# Patient Record
Sex: Male | Born: 1975 | Race: White | Hispanic: No | Marital: Married | Smoking: Light tobacco smoker
Health system: Southern US, Community
[De-identification: ages and names within clinical notes are randomized; demographics above are authoritative.]

## PROBLEM LIST (undated history)

## (undated) DIAGNOSIS — I2510 Atherosclerotic heart disease of native coronary artery without angina pectoris: Secondary | ICD-10-CM

## (undated) DIAGNOSIS — Z720 Tobacco use: Secondary | ICD-10-CM

## (undated) DIAGNOSIS — E785 Hyperlipidemia, unspecified: Secondary | ICD-10-CM

## (undated) DIAGNOSIS — I119 Hypertensive heart disease without heart failure: Secondary | ICD-10-CM

## (undated) DIAGNOSIS — E119 Type 2 diabetes mellitus without complications: Secondary | ICD-10-CM

## (undated) HISTORY — DX: Tobacco use: Z72.0

## (undated) HISTORY — DX: Hypertensive heart disease without heart failure: I11.9

---

## 2005-09-26 ENCOUNTER — Ambulatory Visit: Payer: Self-pay | Admitting: Internal Medicine

## 2009-03-17 ENCOUNTER — Ambulatory Visit: Payer: Self-pay | Admitting: Unknown Physician Specialty

## 2009-04-13 ENCOUNTER — Ambulatory Visit: Payer: Self-pay | Admitting: Unknown Physician Specialty

## 2009-10-07 ENCOUNTER — Ambulatory Visit: Payer: Self-pay | Admitting: Unknown Physician Specialty

## 2015-12-07 ENCOUNTER — Inpatient Hospital Stay
Admission: EM | Admit: 2015-12-07 | Discharge: 2015-12-08 | DRG: 247 | Disposition: A | Discharge status: Home / Self Care | Payer: BC Managed Care – PPO | Attending: Internal Medicine | Admitting: Internal Medicine

## 2015-12-07 ENCOUNTER — Emergency Department: Payer: BC Managed Care – PPO

## 2015-12-07 ENCOUNTER — Encounter
Admission: EM | Disposition: A | Discharge status: Home / Self Care | Payer: Self-pay | Source: Home / Self Care | Attending: Internal Medicine

## 2015-12-07 ENCOUNTER — Encounter: Payer: Self-pay | Admitting: Emergency Medicine

## 2015-12-07 ENCOUNTER — Observation Stay: Payer: BC Managed Care – PPO

## 2015-12-07 DIAGNOSIS — Z833 Family history of diabetes mellitus: Secondary | ICD-10-CM | POA: Diagnosis not present

## 2015-12-07 DIAGNOSIS — E119 Type 2 diabetes mellitus without complications: Secondary | ICD-10-CM | POA: Diagnosis present

## 2015-12-07 DIAGNOSIS — Z9114 Patient's other noncompliance with medication regimen: Secondary | ICD-10-CM

## 2015-12-07 DIAGNOSIS — K760 Fatty (change of) liver, not elsewhere classified: Secondary | ICD-10-CM | POA: Diagnosis present

## 2015-12-07 DIAGNOSIS — R0789 Other chest pain: Secondary | ICD-10-CM | POA: Diagnosis present

## 2015-12-07 DIAGNOSIS — I2510 Atherosclerotic heart disease of native coronary artery without angina pectoris: Secondary | ICD-10-CM

## 2015-12-07 DIAGNOSIS — R079 Chest pain, unspecified: Secondary | ICD-10-CM

## 2015-12-07 DIAGNOSIS — R112 Nausea with vomiting, unspecified: Secondary | ICD-10-CM

## 2015-12-07 DIAGNOSIS — I200 Unstable angina: Secondary | ICD-10-CM

## 2015-12-07 DIAGNOSIS — F17200 Nicotine dependence, unspecified, uncomplicated: Secondary | ICD-10-CM

## 2015-12-07 DIAGNOSIS — I214 Non-ST elevation (NSTEMI) myocardial infarction: Principal | ICD-10-CM | POA: Diagnosis present

## 2015-12-07 DIAGNOSIS — Z880 Allergy status to penicillin: Secondary | ICD-10-CM

## 2015-12-07 DIAGNOSIS — E785 Hyperlipidemia, unspecified: Secondary | ICD-10-CM

## 2015-12-07 DIAGNOSIS — I160 Hypertensive urgency: Secondary | ICD-10-CM | POA: Diagnosis not present

## 2015-12-07 DIAGNOSIS — Y929 Unspecified place or not applicable: Secondary | ICD-10-CM

## 2015-12-07 DIAGNOSIS — I10 Essential (primary) hypertension: Secondary | ICD-10-CM | POA: Diagnosis not present

## 2015-12-07 DIAGNOSIS — I25110 Atherosclerotic heart disease of native coronary artery with unstable angina pectoris: Secondary | ICD-10-CM | POA: Diagnosis present

## 2015-12-07 DIAGNOSIS — I952 Hypotension due to drugs: Secondary | ICD-10-CM | POA: Diagnosis not present

## 2015-12-07 DIAGNOSIS — E1159 Type 2 diabetes mellitus with other circulatory complications: Secondary | ICD-10-CM | POA: Diagnosis not present

## 2015-12-07 DIAGNOSIS — E876 Hypokalemia: Secondary | ICD-10-CM | POA: Diagnosis not present

## 2015-12-07 DIAGNOSIS — T463X5A Adverse effect of coronary vasodilators, initial encounter: Secondary | ICD-10-CM | POA: Diagnosis present

## 2015-12-07 DIAGNOSIS — F17210 Nicotine dependence, cigarettes, uncomplicated: Secondary | ICD-10-CM | POA: Diagnosis present

## 2015-12-07 DIAGNOSIS — Z8249 Family history of ischemic heart disease and other diseases of the circulatory system: Secondary | ICD-10-CM | POA: Diagnosis not present

## 2015-12-07 DIAGNOSIS — T402X5A Adverse effect of other opioids, initial encounter: Secondary | ICD-10-CM | POA: Diagnosis present

## 2015-12-07 HISTORY — PX: CARDIAC CATHETERIZATION: SHX172

## 2015-12-07 HISTORY — DX: Atherosclerotic heart disease of native coronary artery without angina pectoris: I25.10

## 2015-12-07 HISTORY — DX: Hyperlipidemia, unspecified: E78.5

## 2015-12-07 HISTORY — DX: Type 2 diabetes mellitus without complications: E11.9

## 2015-12-07 LAB — CBC
HCT: 49.9 % (ref 40.0–52.0)
Hemoglobin: 17.8 g/dL (ref 13.0–18.0)
MCH: 31.9 pg (ref 26.0–34.0)
MCHC: 35.7 g/dL (ref 32.0–36.0)
MCV: 89.2 fL (ref 80.0–100.0)
Platelets: 210 10*3/uL (ref 150–440)
RBC: 5.59 MIL/uL (ref 4.40–5.90)
RDW: 13 % (ref 11.5–14.5)
WBC: 10.1 10*3/uL (ref 3.8–10.6)

## 2015-12-07 LAB — COMPREHENSIVE METABOLIC PANEL
ALK PHOS: 61 U/L (ref 38–126)
ALT: 33 U/L (ref 17–63)
AST: 25 U/L (ref 15–41)
Albumin: 4.3 g/dL (ref 3.5–5.0)
Anion gap: 14 (ref 5–15)
BUN: 9 mg/dL (ref 6–20)
CALCIUM: 9.1 mg/dL (ref 8.9–10.3)
CHLORIDE: 104 mmol/L (ref 101–111)
CO2: 19 mmol/L — ABNORMAL LOW (ref 22–32)
Creatinine, Ser: 0.79 mg/dL (ref 0.61–1.24)
GFR calc Af Amer: >60 mL/min (ref >60)
Glucose, Bld: 294 mg/dL — ABNORMAL HIGH (ref 65–99)
Potassium: 4 mmol/L (ref 3.5–5.1)
Sodium: 137 mmol/L (ref 135–145)
Total Bilirubin: 0.3 mg/dL (ref 0.3–1.2)
Total Protein: 7.3 g/dL (ref 6.5–8.1)

## 2015-12-07 LAB — LIPID PANEL
CHOL/HDL RATIO: 4.9 ratio
Cholesterol: 244 mg/dL — ABNORMAL HIGH (ref 0–200)
HDL: 50 mg/dL (ref >40)
LDL CALC: 140 mg/dL — AB (ref 0–99)
TRIGLYCERIDES: 272 mg/dL — AB (ref <150)
VLDL: 54 mg/dL — ABNORMAL HIGH (ref 0–40)

## 2015-12-07 LAB — URINE DRUG SCREEN, QUALITATIVE (ARMC ONLY)
AMPHETAMINES, UR SCREEN: NOT DETECTED
Barbiturates, Ur Screen: NOT DETECTED
Benzodiazepine, Ur Scrn: NOT DETECTED
Cannabinoid 50 Ng, Ur ~~LOC~~: NOT DETECTED
Cocaine Metabolite,Ur ~~LOC~~: NOT DETECTED
MDMA (ECSTASY) UR SCREEN: NOT DETECTED
Methadone Scn, Ur: NOT DETECTED
Opiate, Ur Screen: POSITIVE — AB
Phencyclidine (PCP) Ur S: NOT DETECTED
TRICYCLIC, UR SCREEN: NOT DETECTED

## 2015-12-07 LAB — TROPONIN I
TROPONIN I: 4.29 ng/mL — AB (ref <0.03)
Troponin I: 0.17 ng/mL (ref <0.03)
Troponin I: 3.42 ng/mL (ref <0.03)

## 2015-12-07 LAB — LIPASE, BLOOD: LIPASE: 50 U/L (ref 11–51)

## 2015-12-07 LAB — HEMOGLOBIN A1C: Hgb A1c MFr Bld: 11.7 % — ABNORMAL HIGH (ref 4.0–6.0)

## 2015-12-07 SURGERY — LEFT HEART CATH AND CORONARY ANGIOGRAPHY
Anesthesia: Moderate Sedation

## 2015-12-07 MED ORDER — MIDAZOLAM HCL 2 MG/2ML IJ SOLN
INTRAMUSCULAR | Status: AC
  Filled 2015-12-07: qty 2

## 2015-12-07 MED ORDER — HYDRALAZINE HCL 25 MG PO TABS
25.0000 mg | ORAL_TABLET | Freq: Three times a day (TID) | ORAL | Status: DC
  Administered 2015-12-07: 25 mg via ORAL
  Filled 2015-12-07: qty 1

## 2015-12-07 MED ORDER — HYDROCODONE-ACETAMINOPHEN 5-325 MG PO TABS
1.0000 | ORAL_TABLET | Freq: Four times a day (QID) | ORAL | Status: DC | PRN
  Administered 2015-12-07: 1 via ORAL
  Filled 2015-12-07 (×2): qty 1

## 2015-12-07 MED ORDER — FENTANYL CITRATE (PF) 100 MCG/2ML IJ SOLN
INTRAMUSCULAR | Status: AC
  Filled 2015-12-07: qty 2

## 2015-12-07 MED ORDER — ENOXAPARIN SODIUM 40 MG/0.4ML ~~LOC~~ SOLN: 40.0000 mg | SUBCUTANEOUS | Status: DC

## 2015-12-07 MED ORDER — GI COCKTAIL ~~LOC~~: 30.0000 mL | Freq: Four times a day (QID) | ORAL | Status: DC | PRN

## 2015-12-07 MED ORDER — ASPIRIN 81 MG PO CHEW
81.0000 mg | CHEWABLE_TABLET | Freq: Every day | ORAL | Status: DC
  Administered 2015-12-08: 81 mg via ORAL
  Filled 2015-12-07: qty 1

## 2015-12-07 MED ORDER — SODIUM CHLORIDE 0.9% FLUSH: 3.0000 mL | INTRAVENOUS | Status: DC | PRN

## 2015-12-07 MED ORDER — SODIUM CHLORIDE 0.9 % IV SOLN: 250.0000 mL | INTRAVENOUS | Status: DC | PRN

## 2015-12-07 MED ORDER — NITROGLYCERIN 1 MG/10 ML FOR IR/CATH LAB
INTRA_ARTERIAL | Status: DC | PRN
  Administered 2015-12-07: 300 ug via INTRACORONARY
  Administered 2015-12-07: 200 ug via INTRACORONARY

## 2015-12-07 MED ORDER — TICAGRELOR 90 MG PO TABS
ORAL_TABLET | ORAL | Status: DC | PRN
Start: 2015-12-07 — End: 2015-12-07
  Administered 2015-12-07: 180 mg via ORAL

## 2015-12-07 MED ORDER — SODIUM CHLORIDE 0.9% FLUSH: 3.0000 mL | Freq: Two times a day (BID) | INTRAVENOUS | Status: DC

## 2015-12-07 MED ORDER — MORPHINE SULFATE (PF) 4 MG/ML IV SOLN
4.0000 mg | Freq: Once | INTRAVENOUS | Status: AC
  Administered 2015-12-07: 4 mg via INTRAVENOUS
  Filled 2015-12-07: qty 1

## 2015-12-07 MED ORDER — LISINOPRIL 10 MG PO TABS
10.0000 mg | ORAL_TABLET | Freq: Every day | ORAL | Status: DC
  Administered 2015-12-07 – 2015-12-08 (×2): 10 mg via ORAL
  Filled 2015-12-07 (×2): qty 1

## 2015-12-07 MED ORDER — LABETALOL HCL 5 MG/ML IV SOLN
INTRAVENOUS | Status: AC
  Filled 2015-12-07: qty 4

## 2015-12-07 MED ORDER — VERAPAMIL HCL 2.5 MG/ML IV SOLN
INTRAVENOUS | Status: DC | PRN
  Administered 2015-12-07 (×2): 2.5 mg via INTRAVENOUS

## 2015-12-07 MED ORDER — NITROGLYCERIN 0.4 MG SL SUBL
0.4000 mg | SUBLINGUAL_TABLET | SUBLINGUAL | Status: DC | PRN
  Administered 2015-12-07 (×3): 0.4 mg via SUBLINGUAL
  Filled 2015-12-07 (×2): qty 1

## 2015-12-07 MED ORDER — SODIUM CHLORIDE 0.9 % IV SOLN
INTRAVENOUS | Status: DC
  Administered 2015-12-07 – 2015-12-08 (×2): via INTRAVENOUS

## 2015-12-07 MED ORDER — ASPIRIN 81 MG PO CHEW
324.0000 mg | CHEWABLE_TABLET | Freq: Once | ORAL | Status: AC
  Administered 2015-12-07: 324 mg via ORAL
  Filled 2015-12-07: qty 4

## 2015-12-07 MED ORDER — GI COCKTAIL ~~LOC~~
30.0000 mL | Freq: Once | ORAL | Status: AC
  Administered 2015-12-07: 30 mL via ORAL
  Filled 2015-12-07: qty 30

## 2015-12-07 MED ORDER — ATORVASTATIN CALCIUM 20 MG PO TABS
40.0000 mg | ORAL_TABLET | Freq: Every day | ORAL | Status: DC
  Filled 2015-12-07: qty 1

## 2015-12-07 MED ORDER — SODIUM CHLORIDE 0.9 % IV SOLN
INTRAVENOUS | Status: DC
  Administered 2015-12-07: 10:00:00 via INTRAVENOUS

## 2015-12-07 MED ORDER — TICAGRELOR 90 MG PO TABS
ORAL_TABLET | ORAL | Status: AC
  Filled 2015-12-07: qty 2

## 2015-12-07 MED ORDER — ACETAMINOPHEN 325 MG PO TABS
650.0000 mg | ORAL_TABLET | ORAL | Status: DC | PRN
  Administered 2015-12-08: 650 mg via ORAL
  Filled 2015-12-07: qty 2

## 2015-12-07 MED ORDER — CARVEDILOL 6.25 MG PO TABS: 6.2500 mg | ORAL_TABLET | Freq: Two times a day (BID) | ORAL | Status: DC

## 2015-12-07 MED ORDER — LABETALOL HCL 5 MG/ML IV SOLN
20.0000 mg | Freq: Once | INTRAVENOUS | Status: AC
  Administered 2015-12-07: 20 mg via INTRAVENOUS

## 2015-12-07 MED ORDER — IOPAMIDOL (ISOVUE-370) INJECTION 76%
100.0000 mL | Freq: Once | INTRAVENOUS | Status: AC | PRN
  Administered 2015-12-07: 100 mL via INTRAVENOUS

## 2015-12-07 MED ORDER — HEPARIN SODIUM (PORCINE) 1000 UNIT/ML IJ SOLN
INTRAMUSCULAR | Status: AC
  Filled 2015-12-07: qty 1

## 2015-12-07 MED ORDER — MIDAZOLAM HCL 2 MG/2ML IJ SOLN
INTRAMUSCULAR | Status: DC | PRN
  Administered 2015-12-07 (×3): 1 mg via INTRAVENOUS

## 2015-12-07 MED ORDER — HEPARIN SODIUM (PORCINE) 1000 UNIT/ML IJ SOLN
INTRAMUSCULAR | Status: DC | PRN
  Administered 2015-12-07 (×2): 4000 [IU] via INTRAVENOUS

## 2015-12-07 MED ORDER — ASPIRIN 81 MG PO CHEW: 81.0000 mg | CHEWABLE_TABLET | ORAL | Status: DC

## 2015-12-07 MED ORDER — HEPARIN (PORCINE) IN NACL 2-0.9 UNIT/ML-% IJ SOLN
INTRAMUSCULAR | Status: AC
  Filled 2015-12-07: qty 500

## 2015-12-07 MED ORDER — ISOSORBIDE MONONITRATE ER 30 MG PO TB24
30.0000 mg | ORAL_TABLET | Freq: Every day | ORAL | Status: DC
  Administered 2015-12-07 – 2015-12-08 (×2): 30 mg via ORAL
  Filled 2015-12-07 (×2): qty 1

## 2015-12-07 MED ORDER — ONDANSETRON HCL 4 MG/2ML IJ SOLN
4.0000 mg | Freq: Once | INTRAMUSCULAR | Status: AC
  Administered 2015-12-07: 4 mg via INTRAVENOUS
  Filled 2015-12-07: qty 2

## 2015-12-07 MED ORDER — TICAGRELOR 90 MG PO TABS
90.0000 mg | ORAL_TABLET | Freq: Two times a day (BID) | ORAL | Status: DC
  Administered 2015-12-07 – 2015-12-08 (×2): 90 mg via ORAL
  Filled 2015-12-07 (×2): qty 1

## 2015-12-07 MED ORDER — VERAPAMIL HCL 2.5 MG/ML IV SOLN
INTRAVENOUS | Status: AC
  Filled 2015-12-07: qty 2

## 2015-12-07 MED ORDER — FENTANYL CITRATE (PF) 100 MCG/2ML IJ SOLN
50.0000 ug | Freq: Once | INTRAMUSCULAR | Status: AC
  Administered 2015-12-07: 50 ug via INTRAVENOUS
  Filled 2015-12-07: qty 2

## 2015-12-07 MED ORDER — ASPIRIN 81 MG PO CHEW
CHEWABLE_TABLET | ORAL | Status: AC
  Filled 2015-12-07: qty 4

## 2015-12-07 MED ORDER — KETOROLAC TROMETHAMINE 30 MG/ML IJ SOLN
15.0000 mg | Freq: Once | INTRAMUSCULAR | Status: AC
  Administered 2015-12-07: 15 mg via INTRAVENOUS
  Filled 2015-12-07: qty 1

## 2015-12-07 MED ORDER — NITROGLYCERIN 5 MG/ML IV SOLN
INTRAVENOUS | Status: AC
  Filled 2015-12-07: qty 10

## 2015-12-07 MED ORDER — FENTANYL CITRATE (PF) 100 MCG/2ML IJ SOLN
INTRAMUSCULAR | Status: DC | PRN
  Administered 2015-12-07 (×3): 50 ug via INTRAVENOUS

## 2015-12-07 MED ORDER — ATORVASTATIN CALCIUM 20 MG PO TABS: 40.0000 mg | ORAL_TABLET | Freq: Every day | ORAL | Status: DC

## 2015-12-07 MED ORDER — HYDRALAZINE HCL 20 MG/ML IJ SOLN
10.0000 mg | Freq: Once | INTRAMUSCULAR | Status: AC
  Administered 2015-12-07: 10 mg via INTRAVENOUS
  Filled 2015-12-07: qty 1

## 2015-12-07 MED ORDER — ONDANSETRON HCL 4 MG/2ML IJ SOLN: 4.0000 mg | Freq: Four times a day (QID) | INTRAMUSCULAR | Status: DC | PRN

## 2015-12-07 SURGICAL SUPPLY — 23 items
BALLN TREK RX 2.5X12 (BALLOONS) ×3
BALLOON TREK RX 2.5X12 (BALLOONS) ×1 IMPLANT
CANNULA 5F STIFF (CANNULA) ×3 IMPLANT
CATH INFINITI 5 FR JL3.5 (CATHETERS) ×3 IMPLANT
CATH INFINITI 5FR ANG PIGTAIL (CATHETERS) ×3 IMPLANT
CATH INFINITI 5FR JL4 (CATHETERS) ×3 IMPLANT
CATH OPTITORQUE JACKY 4.0 5F (CATHETERS) ×3 IMPLANT
CATH VISTA GUIDE 6FR XB3 (CATHETERS) ×3 IMPLANT
CATH VISTA GUIDE 6FR XB3.5 (CATHETERS) ×3 IMPLANT
DEVICE CLOSURE MYNXGRIP 6/7F (Vascular Products) ×3 IMPLANT
DEVICE INFLAT 30 PLUS (MISCELLANEOUS) ×3 IMPLANT
DEVICE RAD TR BAND REGULAR (VASCULAR PRODUCTS) ×3 IMPLANT
GLIDESHEATH SLEND SS 6F .021 (SHEATH) ×3 IMPLANT
KIT MANI 3VAL PERCEP (MISCELLANEOUS) ×3 IMPLANT
NEEDLE PERC 18GX7CM (NEEDLE) ×3 IMPLANT
PACK CARDIAC CATH (CUSTOM PROCEDURE TRAY) ×3 IMPLANT
SHEATH AVANTI 5FR X 11CM (SHEATH) ×3 IMPLANT
SHEATH AVANTI 6FR X 11CM (SHEATH) ×3 IMPLANT
STENT XIENCE ALPINE RX 3.0X15 (Permanent Stent) ×3 IMPLANT
WIRE EMERALD 3MM-J .035X150CM (WIRE) ×3 IMPLANT
WIRE HITORQ VERSACORE ST 145CM (WIRE) ×3 IMPLANT
WIRE RUNTHROUGH .014X180CM (WIRE) ×3 IMPLANT
WIRE SAFE-T 1.5MM-J .035X260CM (WIRE) ×3 IMPLANT

## 2015-12-07 NOTE — Progress Notes (Signed)
Pt complains of pain in cath sites. MD notified. Orders received. Pts BP is also elevated MD acknowledged. I will give scheduled BP meds and reassess. I will continue to assess.

## 2015-12-07 NOTE — Consult Note (Signed)
Cardiology Consultation Note  Patient ID: Zachary [name], MRN: [id number], DOB/AGE: Jun 24, 1975 40 y.o. Admit date: 12/07/2015   Date of Consult: 12/07/2015 Primary Physician: No PCP Per Patient Primary Cardiologist: [facility] to [facility] Requesting Physician: Dr. Sherryll Burger, MD  Chief Complaint: Chest pain Reason for Consult: Chest pain in the setting of malignant HTN  HPI: 40 y.o. male with h/o DM2 x 7 years not on medications, poorly controlled HTn x 7 years not on medications, HLD, ongoing tobacco and alcohol abuse, anxiety, and depression who presented to [facility] ED on 7/31 after waking up with sudden onset of dull chest pain that radiated to his back.   He does not have any previously known cardiac history and no family history of premature CAD. He notes his BP is always "high" when he checks in at [facility]. He was in his USOH until around 2 AM this morning when he developed sudden onset of chest pain radiating to the back. Pain was 12/10 and not associated with any symptoms. Pain persisted, thus he presented to [facility]. He is very active at baseline and has never had exertional chest pain. He is an everyday smoker of 0.5-1 ppd x 20 years. He drinks at least one beer daily. He denies any illegal drug abuse.   Upon the patient's arrival to [facility] they were found to have troponin negative x 1, K+ 4.0, SCr 0.79, nl WBC, hgb 17.8. ECG as below, CXR negative. CTA chest without evidence of aortic dissection or aneurysm. He has gone for a RUQ ultrasound that is pending at this time. Currently with 7/10 pain.    Past Medical History:  Diagnosis Date  . Diabetes mellitus without complication (HCC)   . Hypertension       Most Recent Cardiac Studies: none   Surgical History: History reviewed. No pertinent surgical history.   Home Meds: Prior to Admission medications   Not on File    Inpatient Medications:  . fentaNYL (SUBLIMAZE) injection  50 mcg Intravenous Once      Allergies:  Allergies  Allergen  Reactions  . Penicillins Nausea And Vomiting and Rash    Has patient had a PCN reaction causing immediate rash, facial/tongue/throat swelling, SOB or lightheadedness with hypotension: No Has patient had a PCN reaction causing severe rash involving mucus membranes or skin necrosis: No Has patient had a PCN reaction that required hospitalization No Has patient had a PCN reaction occurring within the last 10 years: No If all of the above answers are "NO", then may proceed with Cephalosporin use.     Social History   Social History  . Marital status: Married    Spouse name: N/A  . Number of children: N/A  . Years of education: N/A   Occupational History  . Not on file.   Social History Main Topics  . Smoking status: Current Every Day Smoker    Packs/day: 0.50    Years: 17.00  . Smokeless tobacco: Never Used  . Alcohol use Yes  . Drug use: Unknown  . Sexual activity: Not on file   Other Topics Concern  . Not on file   Social History Narrative  . No narrative on file     Family History  Problem Relation Age of Onset  . Hypertension Father   . Cancer Brother   . Diabetes Mellitus II Maternal Grandfather   . Hypertension Maternal Grandfather   . Osteoporosis Paternal Grandmother      Review of Systems: Review of Systems  Constitutional: Positive for malaise/fatigue. Negative for chills, diaphoresis, fever and weight loss.  HENT: Negative for congestion.   Eyes: Negative for discharge and redness.  Respiratory: Negative for cough, sputum production, shortness of breath and wheezing.   Cardiovascular: Positive for chest pain. Negative for palpitations, orthopnea, claudication, leg swelling and PND.  Gastrointestinal: Negative for abdominal pain, heartburn, nausea and vomiting.  Genitourinary: Negative for hematuria.  Musculoskeletal: Negative for falls and myalgias.  Skin: Negative for rash.  Neurological: Negative for dizziness, tingling, tremors, sensory change,  speech change, focal weakness, loss of consciousness and weakness.  Endo/Heme/Allergies: Does not bruise/bleed easily.  Psychiatric/Behavioral: Positive for substance abuse. The patient is not nervous/anxious.   All other systems reviewed and are negative.   Labs:  Recent Labs  12/07/15 0302  TROPONINI <0.03   Lab Results  Component Value Date   WBC 10.1 12/07/2015   HGB 17.8 12/07/2015   HCT 49.9 12/07/2015   MCV 89.2 12/07/2015   PLT 210 12/07/2015     Recent Labs Lab 12/07/15 0302  NA 137  K 4.0  CL 104  CO2 19*  BUN 9  CREATININE 0.79  CALCIUM 9.1  PROT 7.3  BILITOT 0.3  ALKPHOS 61  ALT 33  AST 25  GLUCOSE 294*   No results found for: CHOL, HDL, LDLCALC, TRIG No results found for: DDIMER  Radiology/Studies:  Dg Chest 2 View  Result Date: 12/07/2015 CLINICAL DATA:  40 year old male with chest pain EXAM: CHEST  2 VIEW COMPARISON:  None. FINDINGS: The heart size and mediastinal contours are within normal limits. Both lungs are clear. The visualized skeletal structures are unremarkable. IMPRESSION: No active cardiopulmonary disease. Electronically Signed   By: Elgie Collard M.D.   On: 12/07/2015 03:42  Ct Angio Chest Aorta W And/or Wo Contrast  Result Date: 12/07/2015 CLINICAL DATA:  40 year old male with chest pain and hypertension EXAM: CT ANGIOGRAPHY CHEST WITH CONTRAST TECHNIQUE: Multidetector CT imaging of the chest was performed using the standard protocol during bolus administration of intravenous contrast. Multiplanar CT image reconstructions and MIPs were obtained to evaluate the vascular anatomy. CONTRAST:  100 cc Isovue 370 COMPARISON:  Chest radiograph dated 12/07/2015 FINDINGS: Minimal bibasilar dependent atelectatic changes of the lungs. The lungs are otherwise clear. There is no pleural effusion or pneumothorax. The central airways are patent. The thoracic aorta appears unremarkable. There is no aneurysmal dilatation or evidence of dissection. The  origins of the great vessels of the aortic arch appear patent. Evaluation for the pulmonary arteries is limited due to suboptimal opacification and timing of the contrast. No definite central pulmonary embolus identified. There is no cardiomegaly or pericardial effusion. No hilar or mediastinal adenopathy. The esophagus is grossly unremarkable. There is no axillary adenopathy. The chest wall soft tissues appear unremarkable. The osseous structures are intact. Moderate atrophy of the visualized right kidney. A partially visualized stone in the upper pole of the right kidney measures 2.4 x 1.4 cm Review of the MIP images confirms the above findings. IMPRESSION: No CT evidence of aortic dissection or aneurysm. Electronically Signed   By: Elgie Collard M.D.   On: 12/07/2015 05:10   EKG: Interpreted by me showed: NSR, 91 bpm, no acute st/t changes  Telemetry: Interpreted by me showed: NSR, 90's bpm  Weights: Filed Weights   12/07/15 0255  Weight: 200 lb (90.7 kg)     Physical Exam: Blood pressure (!) 143/99, pulse 75, temperature 97.5 F (36.4 C), temperature source Oral, resp. rate 14, height 5'  9" (1.753 m), weight 200 lb (90.7 kg), SpO2 97 %. Body mass index is 29.53 kg/m. General: Well developed, well nourished, in no acute distress. Head: Normocephalic, atraumatic, sclera non-icteric, no xanthomas, nares are without discharge.  Neck: Negative for carotid bruits. JVD not elevated. Lungs: Clear bilaterally to auscultation without wheezes, rales, or rhonchi. Breathing is unlabored. Heart: RRR with S1 S2. No murmurs, rubs, or gallops appreciated. Abdomen: Soft, non-tender, non-distended with normoactive bowel sounds. No hepatomegaly. No rebound/guarding. No obvious abdominal masses. Msk:  Strength and tone appear normal for age. Extremities: No clubbing or cyanosis. No edema. Distal pedal pulses are 2+ and equal bilaterally. Neuro: Alert and oriented X 3. No facial asymmetry. No focal  deficit. Moves all extremities spontaneously. Psych:  Responds to questions appropriately with a normal affect.    Assessment and Plan:  Principal Problem:   Malignant hypertension Active Problems:   Atypical chest pain   HLD (hyperlipidemia)   Diabetes mellitus (HCC)    1. Chest pain: -Likely in the setting of the patient's malignant HTN -Continue to cycle troponin, initial troponin negative -Imdur -Check echo -Schedule for Lexiscan Myoview on 8/1 to evaluate for high-risk ischemia if troponin remains negative -Check lipid and A1C for risk stratification   2. Malignant hypertension: -Presented with BP 217/124, currently improved s/p hydralazine -Start lisinopril 10 mg and titrate as needed -Imdur/hydralazine  -Follow up bmet  3. HLD: -Check lipid  4. DM2: -Per IM    Signed, Eula Listen, PA-C [facility] Pager: [phone] 12/07/2015, 7:58 AM

## 2015-12-07 NOTE — Progress Notes (Signed)
2cc's of air removed from right TR band 

## 2015-12-07 NOTE — H&P (Signed)
[employer] [facility] [place] [facility]   PATIENT NAME: Zachary [name]    MR#:  [id number]  DATE OF BIRTH:  October 27, 1975  DATE OF ADMISSION:  12/07/2015  PRIMARY CARE PHYSICIAN: No PCP Per Patient   REQUESTING/REFERRING PHYSICIAN: Gayla Doss, MD  CHIEF COMPLAINT:   Chief Complaint  Patient presents with  . Chest Pain  . Shortness of Breath   HISTORY OF PRESENT ILLNESS:  Zachary [name]  is a 40 y.o. male with a known history of HTN, DM is being admitted for unstable angina. Patient is not taking any antihypertensive/diabetic medications He woke up with sudden onset severe substernal chest pain radiating to the back at 2-2:30 AM, constant since onset, no modifying factors. He also reports some radiation of his pain to his left arm which has been resolved.  Currently his pain is 5 out of 10. He reports the pain is associated with shortness of breath and nausea. He has never had this previously.  PAST MEDICAL HISTORY:   Past Medical History:  Diagnosis Date  . Diabetes mellitus without complication (HCC)   . Hypertension    PAST SURGICAL HISTORY:  History reviewed. No pertinent surgical history.  SOCIAL HISTORY:   Social History  Substance Use Topics  . Smoking status: Current Every Day Smoker    Packs/day: 0.50    Years: 17.00  . Smokeless tobacco: Never Used  . Alcohol use Yes  He drinks several beers a day and rum on and off for last 20 years Tells me he has been smoking one pack of severe daily for last 20 years Works as a Runner, broadcasting/film/video in [place] FAMILY HISTORY:   Family History  Problem Relation Age of Onset  . Hypertension Father   . Cancer Brother   . Diabetes Mellitus II Maternal Grandfather   . Hypertension Maternal Grandfather   . Osteoporosis Paternal Grandmother   Father has history of atrial fibrillation and left bundle-branch block Great Grandfather had CHF DRUG ALLERGIES:   Allergies  Allergen Reactions  . Penicillins Nausea And  Vomiting and Rash    Has patient had a PCN reaction causing immediate rash, facial/tongue/throat swelling, SOB or lightheadedness with hypotension: No Has patient had a PCN reaction causing severe rash involving mucus membranes or skin necrosis: No Has patient had a PCN reaction that required hospitalization No Has patient had a PCN reaction occurring within the last 10 years: No If all of the above answers are "NO", then may proceed with Cephalosporin use.     REVIEW OF SYSTEMS:   Review of Systems  Constitutional: Negative for chills, fever and weight loss.  HENT: Negative for nosebleeds and sore throat.   Eyes: Negative for blurred vision.  Respiratory: Positive for shortness of breath. Negative for cough and wheezing.   Cardiovascular: Positive for chest pain. Negative for orthopnea, leg swelling and PND.  Gastrointestinal: Negative for abdominal pain, constipation, diarrhea, heartburn, nausea and vomiting.  Genitourinary: Negative for dysuria and urgency.  Musculoskeletal: Negative for back pain.  Skin: Negative for rash.  Neurological: Negative for dizziness, speech change, focal weakness and headaches.  Endo/Heme/Allergies: Does not bruise/bleed easily.  Psychiatric/Behavioral: Negative for depression.    MEDICATIONS AT HOME:   Prior to Admission medications   Not on File  Takes some over-the-counter vitamins  VITAL SIGNS:  Blood pressure (!) 152/86, pulse 96, temperature 98.3 F (36.8 C), temperature source Oral, resp. rate (!) 21, height 5\' 9"  (1.753 m), weight 86.8 kg (191 lb 6.4  oz), SpO2 94 %. PHYSICAL EXAMINATION:  Physical Exam  GENERAL:  40 y.o.-year-old patient lying in the bed with no acute distress.  EYES: Pupils equal, round, reactive to light and accommodation. No scleral icterus. Extraocular muscles intact.  HEENT: Head atraumatic, normocephalic. Oropharynx and nasopharynx clear.  NECK:  Supple, no jugular venous distention. No thyroid enlargement, no  tenderness.  LUNGS: Normal breath sounds bilaterally, no wheezing, rales,rhonchi or crepitation. No use of accessory muscles of respiration.  CARDIOVASCULAR: S1, S2 normal. No murmurs, rubs, or gallops.  ABDOMEN: Soft, nontender, nondistended. Bowel sounds present. No organomegaly or mass.  EXTREMITIES: No pedal edema, cyanosis, or clubbing.  NEUROLOGIC: Cranial nerves II through XII are intact. Muscle strength 5/5 in all extremities. Sensation intact. Gait not checked.  PSYCHIATRIC: The patient is alert and oriented x 3.  SKIN: No obvious rash, lesion, or ulcer.  LABORATORY PANEL:   CBC  Recent Labs Lab 12/07/15 0302  WBC 10.1  HGB 17.8  HCT 49.9  PLT 210   ------------------------------------------------------------------------------------------------------------------  Chemistries   Recent Labs Lab 12/07/15 0302  NA 137  K 4.0  CL 104  CO2 19*  GLUCOSE 294*  BUN 9  CREATININE 0.79  CALCIUM 9.1  AST 25  ALT 33  ALKPHOS 61  BILITOT 0.3   ------------------------------------------------------------------------------------------------------------------  Cardiac Enzymes  Recent Labs Lab 12/07/15 0824  TROPONINI 0.17*   ------------------------------------------------------------------------------------------------------------------  RADIOLOGY:  Dg Chest 2 View  Result Date: 12/07/2015 CLINICAL DATA:  40 year old male with chest pain EXAM: CHEST  2 VIEW COMPARISON:  None. FINDINGS: The heart size and mediastinal contours are within normal limits. Both lungs are clear. The visualized skeletal structures are unremarkable. IMPRESSION: No active cardiopulmonary disease. Electronically Signed   By: Elgie Collard M.D.   On: 12/07/2015 03:42  Ct Angio Chest Aorta W And/or Wo Contrast  Result Date: 12/07/2015 CLINICAL DATA:  40 year old male with chest pain and hypertension EXAM: CT ANGIOGRAPHY CHEST WITH CONTRAST TECHNIQUE: Multidetector CT imaging of the chest was  performed using the standard protocol during bolus administration of intravenous contrast. Multiplanar CT image reconstructions and MIPs were obtained to evaluate the vascular anatomy. CONTRAST:  100 cc Isovue 370 COMPARISON:  Chest radiograph dated 12/07/2015 FINDINGS: Minimal bibasilar dependent atelectatic changes of the lungs. The lungs are otherwise clear. There is no pleural effusion or pneumothorax. The central airways are patent. The thoracic aorta appears unremarkable. There is no aneurysmal dilatation or evidence of dissection. The origins of the great vessels of the aortic arch appear patent. Evaluation for the pulmonary arteries is limited due to suboptimal opacification and timing of the contrast. No definite central pulmonary embolus identified. There is no cardiomegaly or pericardial effusion. No hilar or mediastinal adenopathy. The esophagus is grossly unremarkable. There is no axillary adenopathy. The chest wall soft tissues appear unremarkable. The osseous structures are intact. Moderate atrophy of the visualized right kidney. A partially visualized stone in the upper pole of the right kidney measures 2.4 x 1.4 cm Review of the MIP images confirms the above findings. IMPRESSION: No CT evidence of aortic dissection or aneurysm. Electronically Signed   By: Elgie Collard M.D.   On: 12/07/2015 05:10  US Abdomen Limited Ruq  Result Date: 12/07/2015 CLINICAL DATA:  Abdominal pain with nausea and vomiting EXAM: US ABDOMEN LIMITED - RIGHT UPPER QUADRANT COMPARISON:  None. FINDINGS: Gallbladder: No gallstones or wall thickening visualized. There is no pericholecystic fluid. No sonographic Murphy sign noted by sonographer. Common bile duct: Diameter: 4 mm. There is  no intrahepatic or extrahepatic biliary duct dilatation. Liver: No focal lesion identified. Liver echogenicity is increased diffusely. IMPRESSION: Diffuse increase in liver echogenicity, a finding most indicative of hepatic steatosis. While  no focal liver lesions are identified, it must be cautioned that sensitivity of ultrasound focal liver lesions is diminished in this circumstance. Study otherwise unremarkable. Electronically Signed   By: Bretta Bang III M.D.   On: 12/07/2015 08:12  IMPRESSION AND PLAN:  40 year old male with known history of hypertension and diabetes is being admitted for unstable angina, possible non-STEMI  * Possible non-STEMI - Discussed the case with cardiology, Dr. Kirke Corin - Obtain 2-D echo - Considering ongoing symptoms and typical chest pain with family history for cardiac disease, he is planning to cath him later today - We will start aspirin, nitroglycerin, ACE inhibitor - Depending on blood pressure and heart rate will consider beta blocker  * Unstable angina - Aspirin, nitroglycerin, ACE inhibitor - Monitor on telemetry - Cardiac catheterization later today - We will get right upper quadrant ultrasound to evaluate for gallbladder disease  * Uncontrolled hypertension - Not taking any medications at home - We will start hydralazine, Imdur and lisinopril - Depending on blood pressure and heart rate will consider beta blocker  * Diabetes - Not on any medications at home - Check hemoglobin A1c - Sliding scale insulin for now - We will start basal insulin and/or oral hypoglycemics, depending on A1c  * Smoking and alcohol abuse - Counseled for smoking cessation for 3 minutes, not ready to quit yet.  Denies any need for nicotine replacement therapy while in the hospital - His ultrasound of the abdomen is showing hepatic steatosis likely damage from chronic alcohol intake - Consult on alcohol cutback and cessation if possible    All the records are reviewed and case discussed with ED provider. Management plans discussed with the patient, family, Cardiology Dr Harlon Flor (PA) and they are in agreement.  CODE STATUS: Full code  TOTAL TIME TAKING CARE OF THIS PATIENT: 45 minutes.     [facility], Gurtha Picker M.D on 12/07/2015 at 10:10 AM  Between 7am to 6pm - Pager - [phone]  After 6pm go to [email or web address] - Scientist, research (life sciences) [facility]  Office  [phone]  CC: Primary care physician; No PCP Per Patient   Note: This dictation was prepared with Dragon dictation along with smaller phrase technology. Any transcriptional errors that result from this process are unintentional.

## 2015-12-07 NOTE — ED Notes (Addendum)
After giving ordered nitro and morphine, pt's BP dropped from 160s systolic to 107 systolic and pt became bradycardic in the 40s. Pt nauseated and diaphoretic. MD in another pt's room but charge RN notified. NS hung.

## 2015-12-07 NOTE — Progress Notes (Signed)
Pts troponin level is elevated at 0.17. Sherryll Burger MD notified. Acknowledged. No new orders at this time. I will continue to assess.

## 2015-12-07 NOTE — Care Management (Signed)
Admitted with symptoms concerning for nstemi.  Mild bump in troponins with significant chest pain.  Markedly elevated blood pressure.  On no home medications other than vitamins.   Independent in all adls, denies issues accessing medical care, obtaining medications or with transportation.   No discharge needs identified at present by care manager or members of care team

## 2015-12-07 NOTE — Progress Notes (Signed)
2cc's of air removed right TR band.  No bleeding noted from site.

## 2015-12-07 NOTE — Progress Notes (Signed)
2cc's of air removed from right TR band.  Site without bleeding.  No c/o from patient.

## 2015-12-07 NOTE — Care Management (Signed)
Patient required coronary PCI intervention.

## 2015-12-07 NOTE — Interval H&P Note (Signed)
Cath Lab Visit (complete for each Cath Lab visit)  Clinical Evaluation Leading to the Procedure:   ACS: Yes.    Non-ACS:    Anginal Classification: CCS IV  Anti-ischemic medical therapy: No Therapy  Non-Invasive Test Results: No non-invasive testing performed  Prior CABG: No previous CABG      History and Physical Interval Note:  12/07/2015 11:47 AM  Nelda Severe IV  has presented today for surgery, with the diagnosis of NSTEMI  The various methods of treatment have been discussed with the patient and family. After consideration of risks, benefits and other options for treatment, the patient has consented to  Procedure(s): Left Heart Cath and Coronary Angiography (N/A) as a surgical intervention .  The patient's history has been reviewed, patient examined, no change in status, stable for surgery.  I have reviewed the patient's chart and labs.  Questions were answered to the patient's satisfaction.     Lorine Bears

## 2015-12-07 NOTE — ED Triage Notes (Signed)
Patient was waken from his sleep about 1 hour ago with chest pain and shortness of breath. Patient with a history of htn and has not taken his medication for 2 years.

## 2015-12-07 NOTE — ED Provider Notes (Signed)
[facility] Emergency Department Provider Note   ____________________________________________   First MD Initiated Contact with Patient 12/07/15 0302     (approximate)  I have reviewed the triage vital signs and the nursing notes.   HISTORY  Chief Complaint Chest Pain and Shortness of Breath    HPI Zachary [name] is a 40 y.o. male with history of diabetes and hypertension, not on any antihypertensive medications at this time because he self-continued them who presents for evaluation of sudden onset [name] sternal chest pain radiating to the back that woke him from sleep at 2 AM, constant since onset, no modifying factors. He reports the pain is associated with shortness of breath. He has never had this previously. No recent illness including no vomiting, diarrhea, fevers or chills. He denies any personal history of coronary artery disease, is unsure whether or not he has any family history of early coronary artery disease.   Past Medical History:  Diagnosis Date  . Diabetes mellitus without complication (HCC)   . Hypertension     Patient Active Problem List   Diagnosis Date Noted  . Chest pain 12/07/2015    History reviewed. No pertinent surgical history.  Prior to Admission medications   Not on File    Allergies Penicillins  No family history on file.  Social History Social History  Substance Use Topics  . Smoking status: Current Every Day Smoker  . Smokeless tobacco: Never Used  . Alcohol use Not on file    Review of Systems Constitutional: No fever/chills Eyes: No visual changes. ENT: No sore throat. Cardiovascular: + chest pain. Respiratory: + shortness of breath. Gastrointestinal: No abdominal pain.  No nausea, no vomiting.  No diarrhea.  No constipation. Genitourinary: Negative for dysuria. Musculoskeletal: Negative for back pain. Skin: Negative for rash. Neurological: Negative for headaches, focal weakness or  numbness.  10-point ROS otherwise negative.  ____________________________________________   PHYSICAL EXAM:  Vitals:   12/07/15 0546 12/07/15 0615 12/07/15 0630 12/07/15 0706  BP: 123/81 (!) 133/95 (!) 159/103 (!) 143/99  Pulse: 69 84 68 75  Resp: 17 18 17 14   Temp:      TempSrc:      SpO2: 97% 95% 96% 97%  Weight:      Height:        VITAL SIGNS: ED Triage Vitals [12/07/15 0255]  Enc Vitals Group     BP (!) 217/124     Pulse Rate 97     Resp 18     Temp 97.5 F (36.4 C)     Temp Source Oral     SpO2 98 %     Weight 200 lb (90.7 kg)     Height 5\' 9"  (1.753 m)     Head Circumference      Peak Flow      Pain Score 8     Pain Loc      Pain Edu?      Excl. in GC?     Constitutional: Alert and oriented. In distress secondary to pain. Eyes: Conjunctivae are normal. PERRL. EOMI. Head: Atraumatic. Nose: No congestion/rhinnorhea. Mouth/Throat: Mucous membranes are moist.  Oropharynx non-erythematous. Neck: No stridor.   Cardiovascular: Normal rate, regular rhythm. Grossly normal heart sounds.  Good peripheral circulation. Respiratory: Normal respiratory effort.  No retractions. Lungs CTAB. Gastrointestinal: Soft and nontender. No distention.  No CVA tenderness. Genitourinary: deferred Musculoskeletal: No lower extremity tenderness nor edema.  No joint effusions. Neurologic:  Normal speech and language.  No gross focal neurologic deficits are appreciated. No gait instability. Skin:  Skin is warm, dry and intact. No rash noted. Psychiatric: Mood and affect are normal. Speech and behavior are normal.  ____________________________________________   LABS (all labs ordered are listed, but only abnormal results are displayed)  Labs Reviewed  COMPREHENSIVE METABOLIC PANEL - Abnormal; Notable for the following:       Result Value   CO2 19 (*)    Glucose, Bld 294 (*)    All other components within normal limits  CBC  TROPONIN I  LIPASE, BLOOD    ____________________________________________  EKG  ED ECG REPORT I, Gayla Doss, the attending physician, personally viewed and interpreted this ECG.   Date: 12/07/2015  EKG Time: 02:59  Rate: 91  Rhythm: normal EKG, normal sinus rhythm  Axis: normal  Intervals:none  ST&T Change: No acute ST elevation or acute ST depression.  ____________________________________________  RADIOLOGY  CXR IMPRESSION: No active cardiopulmonary disease.   CTA chest IMPRESSION: No CT evidence of aortic dissection or aneurysm.  ____________________________________________   PROCEDURES  Procedure(s) performed: None  Procedures  Critical Care performed: No  ____________________________________________   INITIAL IMPRESSION / ASSESSMENT AND PLAN / ED COURSE  Pertinent labs & imaging results that were available during my care of the patient were reviewed by me and considered in my medical decision making (see chart for details).  Zachary [name] [name] is a 40 y.o. male with history of diabetes and hypertension, not on any antihypertensive medications at this time because he self-continued them who presents for evaluation of sudden onset [name] sternal chest pain radiating to the back that woke him from sleep at 2 AM. On arrival to the emergency department he is in significant distress secondary to his pain. He is also severely hypertensive with initial blood pressure of 217/124 likely secondary to medication noncompliance. The remainder of his vital signs are stable and he is afebrile. EKG is reassuring, not consistent with ischemia. Concern for hypertensive urgency versus emergency, we'll give hydralazine, obtain screening labs, CTA chest, treat his pain and reassess for disposition.  ----------------------------------------- 4:38 AM on 12/07/2015 ----------------------------------------- Patient with hypotension after nitroglycerin and morphine administration improving with [name] fluids.  Current systolic blood pressure is 106. Patient reports that his pain has not improved after nitroglycerin and morphine, we'll hold off on any additional medications that could decrease his blood pressure at this time.  ----------------------------------------- 6:12 AM on 12/07/2015 ----------------------------------------- Patient reports some improvement of his chest pain after Toradol and GI cocktail but still rating pain at 7 out of 10. His blood pressure has improved, currently 143/99. CTA chest negative. At this time, the exact cause of his pain is unclear to me, EKG is reassuring, troponin negative, does not appear consistent with ACS. Question hypertensive urgency though his BP has improved significantly. Given his persistent pain, I discussed the case with the hospitalist for admission at this time.   Clinical Course  Value Comment By Time  CT Angio Chest Aorta W and/or Wo Contrast (Reviewed) Gayla Doss, MD 07/31 0715     ____________________________________________   FINAL CLINICAL IMPRESSION(S) / ED DIAGNOSES  Final diagnoses:  Atypical chest pain  Essential hypertension  Hypertensive urgency  Nausea and vomiting      NEW MEDICATIONS STARTED DURING THIS VISIT:  New Prescriptions   No medications on file     Note:  This document was prepared using Dragon voice recognition software and may include unintentional dictation errors.  Gayla Doss, MD 12/07/15 [phone]

## 2015-12-07 NOTE — Progress Notes (Signed)
3cc's of air removed from right TR band 

## 2015-12-07 NOTE — H&P (View-Only) (Signed)
 Cardiology Consultation Note  Patient ID: Zachary [name], MRN: [id number], DOB/AGE: 38/14/1977 39 y.o. Admit date: 12/07/2015   Date of Consult: 12/07/2015 Primary Physician: No PCP Per Patient Primary Cardiologist: New to [facility] Requesting Physician: Dr. Shah, MD  Chief Complaint: Chest pain Reason for Consult: Chest pain in the setting of malignant HTN  HPI: 39 y.o. male with h/o DM2 x 7 years not on medications, poorly controlled HTn x 7 years not on medications, HLD, ongoing tobacco and alcohol abuse, anxiety, and depression who presented to [facility] ED on 7/31 after waking up with sudden onset of dull chest pain that radiated to his back.   He does not have any previously known cardiac history and no family history of premature CAD. He notes his BP is always "high" when he checks in at [employer]. He was in his USOH until around 2 AM this morning when he developed sudden onset of chest pain radiating to the back. Pain was 12/10 and not associated with any symptoms. Pain persisted, thus he presented to [facility]. He is very active at baseline and has never had exertional chest pain. He is an everyday smoker of 0.5-1 ppd x 20 years. He drinks at least one beer daily. He denies any illegal drug abuse.   Upon the patient's arrival to [facility] they were found to have troponin negative x 1, K+ 4.0, SCr 0.79, nl WBC, hgb 17.8. ECG as below, CXR negative. CTA chest without evidence of aortic dissection or aneurysm. He has gone for a RUQ ultrasound that is pending at this time. Currently with 7/10 pain.    Past Medical History:  Diagnosis Date  . Diabetes mellitus without complication (HCC)   . Hypertension       Most Recent Cardiac Studies: none   Surgical History: History reviewed. No pertinent surgical history.   Home Meds: Prior to Admission medications   Not on File    Inpatient Medications:  . fentaNYL (SUBLIMAZE) injection  50 mcg Intravenous Once      Allergies:  Allergies  Allergen  Reactions  . Penicillins Nausea And Vomiting and Rash    Has patient had a PCN reaction causing immediate rash, facial/tongue/throat swelling, SOB or lightheadedness with hypotension: No Has patient had a PCN reaction causing severe rash involving mucus membranes or skin necrosis: No Has patient had a PCN reaction that required hospitalization No Has patient had a PCN reaction occurring within the last 10 years: No If all of the above answers are "NO", then may proceed with Cephalosporin use.     Social History   Social History  . Marital status: Married    Spouse name: N/A  . Number of children: N/A  . Years of education: N/A   Occupational History  . Not on file.   Social History Main Topics  . Smoking status: Current Every Day Smoker    Packs/day: 0.50    Years: 17.00  . Smokeless tobacco: Never Used  . Alcohol use Yes  . Drug use: Unknown  . Sexual activity: Not on file   Other Topics Concern  . Not on file   Social History Narrative  . No narrative on file     Family History  Problem Relation Age of Onset  . Hypertension Father   . Cancer Brother   . Diabetes Mellitus II Maternal Grandfather   . Hypertension Maternal Grandfather   . Osteoporosis Paternal Grandmother      Review of Systems: Review of Systems    Constitutional: Positive for malaise/fatigue. Negative for chills, diaphoresis, fever and weight loss.  HENT: Negative for congestion.   Eyes: Negative for discharge and redness.  Respiratory: Negative for cough, sputum production, shortness of breath and wheezing.   Cardiovascular: Positive for chest pain. Negative for palpitations, orthopnea, claudication, leg swelling and PND.  Gastrointestinal: Negative for abdominal pain, heartburn, nausea and vomiting.  Genitourinary: Negative for hematuria.  Musculoskeletal: Negative for falls and myalgias.  Skin: Negative for rash.  Neurological: Negative for dizziness, tingling, tremors, sensory change,  speech change, focal weakness, loss of consciousness and weakness.  Endo/Heme/Allergies: Does not bruise/bleed easily.  Psychiatric/Behavioral: Positive for substance abuse. The patient is not nervous/anxious.   All other systems reviewed and are negative.   Labs:  Recent Labs  12/07/15 0302  TROPONINI <0.03   Lab Results  Component Value Date   WBC 10.1 12/07/2015   HGB 17.8 12/07/2015   HCT 49.9 12/07/2015   MCV 89.2 12/07/2015   PLT 210 12/07/2015     Recent Labs Lab 12/07/15 0302  NA 137  K 4.0  CL 104  CO2 19*  BUN 9  CREATININE 0.79  CALCIUM 9.1  PROT 7.3  BILITOT 0.3  ALKPHOS 61  ALT 33  AST 25  GLUCOSE 294*   No results found for: CHOL, HDL, LDLCALC, TRIG No results found for: DDIMER  Radiology/Studies:  Dg Chest 2 View  Result Date: 12/07/2015 CLINICAL DATA:  39-year-old male with chest pain EXAM: CHEST  2 VIEW COMPARISON:  None. FINDINGS: The heart size and mediastinal contours are within normal limits. Both lungs are clear. The visualized skeletal structures are unremarkable. IMPRESSION: No active cardiopulmonary disease. Electronically Signed   By: Arash  Radparvar M.D.   On: 12/07/2015 03:42  Ct Angio Chest Aorta W And/or Wo Contrast  Result Date: 12/07/2015 CLINICAL DATA:  39-year-old male with chest pain and hypertension EXAM: CT ANGIOGRAPHY CHEST WITH CONTRAST TECHNIQUE: Multidetector CT imaging of the chest was performed using the standard protocol during bolus administration of intravenous contrast. Multiplanar CT image reconstructions and MIPs were obtained to evaluate the vascular anatomy. CONTRAST:  100 cc Isovue 370 COMPARISON:  Chest radiograph dated 12/07/2015 FINDINGS: Minimal bibasilar dependent atelectatic changes of the lungs. The lungs are otherwise clear. There is no pleural effusion or pneumothorax. The central airways are patent. The thoracic aorta appears unremarkable. There is no aneurysmal dilatation or evidence of dissection. The  origins of the great vessels of the aortic arch appear patent. Evaluation for the pulmonary arteries is limited due to suboptimal opacification and timing of the contrast. No definite central pulmonary embolus identified. There is no cardiomegaly or pericardial effusion. No hilar or mediastinal adenopathy. The esophagus is grossly unremarkable. There is no axillary adenopathy. The chest wall soft tissues appear unremarkable. The osseous structures are intact. Moderate atrophy of the visualized right kidney. A partially visualized stone in the upper pole of the right kidney measures 2.4 x 1.4 cm Review of the MIP images confirms the above findings. IMPRESSION: No CT evidence of aortic dissection or aneurysm. Electronically Signed   By: Arash  Radparvar M.D.   On: 12/07/2015 05:10   EKG: Interpreted by me showed: NSR, 91 bpm, no acute st/t changes  Telemetry: Interpreted by me showed: NSR, 90's bpm  Weights: Filed Weights   12/07/15 0255  Weight: 200 lb (90.7 kg)     Physical Exam: Blood pressure (!) 143/99, pulse 75, temperature 97.5 F (36.4 C), temperature source Oral, resp. rate 14, height 5'   9" (1.753 m), weight 200 lb (90.7 kg), SpO2 97 %. Body mass index is 29.53 kg/m. General: Well developed, well nourished, in no acute distress. Head: Normocephalic, atraumatic, sclera non-icteric, no xanthomas, nares are without discharge.  Neck: Negative for carotid bruits. JVD not elevated. Lungs: Clear bilaterally to auscultation without wheezes, rales, or rhonchi. Breathing is unlabored. Heart: RRR with S1 S2. No murmurs, rubs, or gallops appreciated. Abdomen: Soft, non-tender, non-distended with normoactive bowel sounds. No hepatomegaly. No rebound/guarding. No obvious abdominal masses. Msk:  Strength and tone appear normal for age. Extremities: No clubbing or cyanosis. No edema. Distal pedal pulses are 2+ and equal bilaterally. Neuro: Alert and oriented X 3. No facial asymmetry. No focal  deficit. Moves all extremities spontaneously. Psych:  Responds to questions appropriately with a normal affect.    Assessment and Plan:  Principal Problem:   Malignant hypertension Active Problems:   Atypical chest pain   HLD (hyperlipidemia)   Diabetes mellitus (HCC)    1. Chest pain: -Likely in the setting of the patient's malignant HTN -Continue to cycle troponin, initial troponin negative -Imdur -Check echo -Schedule for Lexiscan Myoview on 8/1 to evaluate for high-risk ischemia if troponin remains negative -Check lipid and A1C for risk stratification   2. Malignant hypertension: -Presented with BP 217/124, currently improved s/p hydralazine -Start lisinopril 10 mg and titrate as needed -Imdur/hydralazine  -Follow up bmet  3. HLD: -Check lipid  4. DM2: -Per IM    Signed, Marylouise Mallet, PA-C [facility] Pager: [phone] 12/07/2015, 7:58 AM   

## 2015-12-07 NOTE — ED Notes (Signed)
Pt in [place]

## 2015-12-07 NOTE — Progress Notes (Signed)
Inpatient Diabetes Program Recommendations  AACE/ADA: New Consensus Statement on Inpatient Glycemic Control (2015)  Target Ranges:  Prepandial:   less than 140 mg/dL      Peak postprandial:   less than 180 mg/dL (1-2 hours)      Critically ill patients:  140 - 180 mg/dL   Results for HARWOOD, DINSMOOR (MRN [id number]) as of 12/07/2015 09:26  Ref. Range 12/07/2015 03:02  Glucose Latest Ref Range: 65 - 99 mg/dL 643 (H)    Admit with: CP/ SOB  History: DM, HTN  Home DM Meds: None for 7 years  Current Insulin Orders: None yet     MD- Please consider placing orders for Novolog Moderate Correction Scale/ SSI (0-15 units) TID AC + HS     --Will follow patient during hospitalization--  Ambrose Finland RN, MSN, CDE Diabetes Coordinator Inpatient Glycemic Control Team Team Pager: [phone] (8a-5p)

## 2015-12-07 NOTE — Progress Notes (Signed)
    Second troponin came back at 0.17. Continue to trend to dictate ischemic workup given malignant hypertension and non-acute EKG. Check echo this morning. Improve BP.

## 2015-12-07 NOTE — Progress Notes (Signed)
Pt returns from cath lab status post right heart cath with stent placement. BP is elevated, I will administer scheduled medication and monitor. Right groin and right wrist dressings are clean dry and intact. Pt is chest pain free and only complains of soreness of cath sites. I will continue to monitor.

## 2015-12-07 NOTE — Progress Notes (Signed)
Right TR band removed.  4x4 and tegaderm applied.  No c/o from patient and no distress noted.

## 2015-12-08 ENCOUNTER — Encounter: Payer: Self-pay | Admitting: Physician Assistant

## 2015-12-08 ENCOUNTER — Other Ambulatory Visit: Payer: BC Managed Care – PPO

## 2015-12-08 ENCOUNTER — Telehealth: Payer: Self-pay

## 2015-12-08 DIAGNOSIS — I214 Non-ST elevation (NSTEMI) myocardial infarction: Principal | ICD-10-CM

## 2015-12-08 DIAGNOSIS — I160 Hypertensive urgency: Secondary | ICD-10-CM

## 2015-12-08 DIAGNOSIS — Z720 Tobacco use: Secondary | ICD-10-CM

## 2015-12-08 DIAGNOSIS — E785 Hyperlipidemia, unspecified: Secondary | ICD-10-CM

## 2015-12-08 DIAGNOSIS — E1159 Type 2 diabetes mellitus with other circulatory complications: Secondary | ICD-10-CM

## 2015-12-08 DIAGNOSIS — F17200 Nicotine dependence, unspecified, uncomplicated: Secondary | ICD-10-CM

## 2015-12-08 LAB — CBC
HCT: 40.6 % (ref 40.0–52.0)
HEMOGLOBIN: 14.4 g/dL (ref 13.0–18.0)
MCH: 31.4 pg (ref 26.0–34.0)
MCHC: 35.4 g/dL (ref 32.0–36.0)
MCV: 88.7 fL (ref 80.0–100.0)
PLATELETS: 165 10*3/uL (ref 150–440)
RBC: 4.58 MIL/uL (ref 4.40–5.90)
RDW: 13.4 % (ref 11.5–14.5)
WBC: 11.3 10*3/uL — ABNORMAL HIGH (ref 3.8–10.6)

## 2015-12-08 LAB — BASIC METABOLIC PANEL
ANION GAP: 9 (ref 5–15)
BUN: 11 mg/dL (ref 6–20)
CALCIUM: 8.2 mg/dL — AB (ref 8.9–10.3)
CO2: 23 mmol/L (ref 22–32)
CREATININE: 0.74 mg/dL (ref 0.61–1.24)
Chloride: 104 mmol/L (ref 101–111)
Glucose, Bld: 248 mg/dL — ABNORMAL HIGH (ref 65–99)
Potassium: 3.6 mmol/L (ref 3.5–5.1)
SODIUM: 136 mmol/L (ref 135–145)

## 2015-12-08 MED ORDER — METOPROLOL TARTRATE 5 MG/5ML IV SOLN
5.0000 mg | Freq: Once | INTRAVENOUS | Status: AC
  Administered 2015-12-08: 5 mg via INTRAVENOUS

## 2015-12-08 MED ORDER — ISOSORBIDE MONONITRATE ER 30 MG PO TB24: 30.0000 mg | ORAL_TABLET | Freq: Every day | ORAL | 1 refills | Status: DC

## 2015-12-08 MED ORDER — TICAGRELOR 90 MG PO TABS
90.0000 mg | ORAL_TABLET | Freq: Two times a day (BID) | ORAL | 1 refills | Status: DC
Start: 2015-12-08 — End: 2016-12-19

## 2015-12-08 MED ORDER — CARVEDILOL 12.5 MG PO TABS: 12.5000 mg | ORAL_TABLET | Freq: Two times a day (BID) | ORAL | Status: DC

## 2015-12-08 MED ORDER — ATORVASTATIN CALCIUM 40 MG PO TABS: 40.0000 mg | ORAL_TABLET | Freq: Every day | ORAL | 1 refills | Status: AC

## 2015-12-08 MED ORDER — GLIPIZIDE ER 5 MG PO TB24: 5.0000 mg | ORAL_TABLET | Freq: Every day | ORAL | Status: DC

## 2015-12-08 MED ORDER — HYDROCODONE-ACETAMINOPHEN 5-325 MG PO TABS: 1.0000 | ORAL_TABLET | Freq: Two times a day (BID) | ORAL | 0 refills | Status: DC | PRN

## 2015-12-08 MED ORDER — POTASSIUM CHLORIDE CRYS ER 20 MEQ PO TBCR
40.0000 meq | EXTENDED_RELEASE_TABLET | Freq: Once | ORAL | Status: AC
  Administered 2015-12-08: 40 meq via ORAL
  Filled 2015-12-08: qty 2

## 2015-12-08 MED ORDER — HYDRALAZINE HCL 20 MG/ML IJ SOLN: 10.0000 mg | Freq: Once | INTRAMUSCULAR | Status: AC

## 2015-12-08 MED ORDER — HYDROCODONE-ACETAMINOPHEN 5-325 MG PO TABS: 1.0000 | ORAL_TABLET | Freq: Four times a day (QID) | ORAL | 0 refills | Status: DC | PRN

## 2015-12-08 MED ORDER — METOPROLOL TARTRATE 5 MG/5ML IV SOLN
INTRAVENOUS | Status: AC
  Filled 2015-12-08: qty 5

## 2015-12-08 MED ORDER — CARVEDILOL 25 MG PO TABS: 25.0000 mg | ORAL_TABLET | Freq: Two times a day (BID) | ORAL | 0 refills | Status: DC

## 2015-12-08 MED ORDER — ASPIRIN 81 MG PO CHEW: 81.0000 mg | CHEWABLE_TABLET | Freq: Every day | ORAL | 1 refills | Status: AC

## 2015-12-08 MED ORDER — GLIPIZIDE 5 MG PO TABS: 5.0000 mg | ORAL_TABLET | Freq: Two times a day (BID) | ORAL | 0 refills | Status: DC

## 2015-12-08 MED ORDER — HYDRALAZINE HCL 20 MG/ML IJ SOLN
INTRAMUSCULAR | Status: AC
  Administered 2015-12-08: 10 mg
  Filled 2015-12-08: qty 1

## 2015-12-08 MED ORDER — CARVEDILOL 12.5 MG PO TABS
12.5000 mg | ORAL_TABLET | Freq: Two times a day (BID) | ORAL | 1 refills | Status: DC
Start: 2015-12-08 — End: 2015-12-08

## 2015-12-08 MED ORDER — LISINOPRIL 10 MG PO TABS: 10.0000 mg | ORAL_TABLET | Freq: Every day | ORAL | 1 refills | Status: AC

## 2015-12-08 NOTE — Care Management (Signed)
Provided patient with Brilinta coupon.  he verbalizes he does have pharmacy coverage with his insurance.  Also provided brochure for out patient cardiac rehab.  no other needs.

## 2015-12-08 NOTE — Progress Notes (Signed)
Patient alert and oriented, vss, no complaints of pain.  Tele removed and PIV removed.  No questions at this time.  Groin and radial site without hematoma.  Vascular discharge instructions given to patient. F/u appts made.  Patient escorted out of hospital via wheelchar by volunteers.

## 2015-12-08 NOTE — Discharge Instructions (Signed)
Heart Attack °A heart attack (myocardial infarction, MI) causes damage to the heart that cannot be fixed. A heart attack often happens when a blood clot or other blockage cuts blood flow to the heart. When this happens, certain areas of the heart begin to die. This causes the pain you feel during a heart attack. °HOME CARE °· Take medicine as told by your doctor. You may need medicine to: °¨ Keep your blood from clotting too easily. °¨ Control your blood pressure. °¨ Lower your cholesterol. °¨ Control abnormal heart rhythms. °· Change certain behaviors as told by your doctor. This may include: °¨ Quitting smoking. °¨ Being active. °¨ Eating a heart-healthy diet. Ask your doctor for help with this diet. °¨ Keeping a healthy weight. °¨ Keeping your diabetes under control. °¨ Lessening stress. °¨ Limiting how much alcohol you drink. °Do not take these medicines unless your doctor says that you can: °· Nonsteroidal anti-inflammatory drugs (NSAIDs). These include: °¨ Ibuprofen. °¨ Naproxen. °¨ Celecoxib. °· Vitamin supplements that have vitamin A, vitamin E, or both. °· Hormone therapy that contains estrogen with or without progestin. °GET HELP RIGHT AWAY IF: °· You have sudden chest discomfort. °· You have sudden discomfort in your: °¨ Arms. °¨ Back. °¨ Neck. °¨ Jaw. °· You have shortness of breath at any time. °· You have sudden sweating or clammy skin. °· You feel sick to your stomach (nauseous) or throw up (vomit). °· You suddenly get light-headed or dizzy. °· You feel your heart beating fast or skipping beats. °These symptoms may be an emergency. Do not wait to see if the symptoms will go away. Get medical help right away. Call your local emergency services (911 in the [place] Do not drive yourself to the hospital. °  °This information is not intended to replace advice given to you by your health care provider. Make sure you discuss any questions you have with your health care provider. °  °Document Released:  10/25/2011 Document Revised: 09/09/2014 Document Reviewed: 06/28/2013 °Elsevier Interactive Patient Education ©2016 [employer] ° °

## 2015-12-08 NOTE — Progress Notes (Addendum)
Inpatient Diabetes Program Recommendations  AACE/ADA: New Consensus Statement on Inpatient Glycemic Control (2015)  Target Ranges:  Prepandial:   less than 140 mg/dL      Peak postprandial:   less than 180 mg/dL (1-2 hours)      Critically ill patients:  140 - 180 mg/dL   Results for KAHLE, REASER (MRN [id number]) as of 12/08/2015 11:01  Ref. Range 12/07/2015 03:02 12/07/2015 08:24 12/08/2015 05:11  Hemoglobin A1C Latest Ref Range: 4.0 - 6.0 %  11.7 (H)   Glucose Latest Ref Range: 65 - 99 mg/dL 967 (H)  591 (H)   Review of Glycemic Control  Diabetes history: DM2 Outpatient Diabetes medications: Per chart no DM medications in 7 years Current orders for Inpatient glycemic control: None  Inpatient Diabetes Program Recommendations: HgbA1C: A1C 11.7% on 12/07/15 indicating an average glucose of 289 mg/dl over the past 2-3 months. Patient will need to be discharged on DM medication and follow up with PCP regarding glycemic control.  Addendum: Talked with patient over the phone regarding glycemic control. Patient reports that he has a history of diabetes and he use to be on oral DM medication in the past. However, he changed his diet and restricted carbohydrates and was able to come off DM medication several years ago. Patient currently does not a PCP and usually goes to the Urgent Care for any health issues that arise. Encouraged patient to check with insurance to get a list of providers in the area so he can establish care with PCP.  Discussed A1C results (11.7% on 12/07/15) and explained that his current A1C indicates an average glucose of 289 mg/dl over the past 2-3 months. Discussed glucose and A1C goals. Discussed importance of checking CBGs and maintaining good CBG control to prevent long-term and short-term complications. Explained how hyperglycemia leads to damage within blood vessels which lead to the common complications seen with uncontrolled diabetes. Stressed to the patient the importance of  improving glycemic control to prevent further complications from uncontrolled diabetes. Discussed impact of nutrition, exercise, stress, sickness, and medications on diabetes control. Patient states that he tries to follow a carb modified diet but admits that he has slacked off over the past couple of months of summer. Informed patient that discharging MD was prescribing Glipizide. Discussed Glipizide and how it works for diabetes control.  Again encouraged patient to establish care with local PCP, check his glucose at least once a day (patient reports he has a glucometer and testing supplies at home), and to notify new PCP (once established) if glucose continues to be elevated despite taking the Glipizide as prescribed.  Patient verbalized understanding of information discussed and he states that he has no further questions at this time related to diabetes.  Thanks, Orlando Penner, RN, MSN, CDE Diabetes Coordinator Inpatient Diabetes Program [phone] (Team Pager from 8am to 5pm) [phone] ([facility] office) [phone] [address] [facility] office) [phone] [facility] office)

## 2015-12-08 NOTE — Progress Notes (Signed)
Patient: Zachary [name] IV / Admit Date: 12/07/2015 / Date of Encounter: 12/08/2015, 8:32 AM   Subjective: Status post cardiac cath on 7/31 that showed OM1 with 80% stenosis with large thrombus burden. Vessel was large. Otherwise there was no obstructive disease. EF 55-65%. No further chest pain. BP improving, though still elevated. Has ambulated in the room without issues. Tolerating medications.   Review of Systems: Review of Systems  Constitutional: Negative for chills, diaphoresis, fever, malaise/fatigue and weight loss.  HENT: Negative for congestion.   Eyes: Negative for discharge and redness.  Respiratory: Negative for cough, sputum production, shortness of breath and wheezing.   Cardiovascular: Negative for chest pain, palpitations, orthopnea, claudication, leg swelling and PND.  Gastrointestinal: Negative for abdominal pain, heartburn, nausea and vomiting.  Musculoskeletal: Negative for falls and myalgias.  Skin: Negative for rash.  Neurological: Negative for dizziness, tingling, tremors, sensory change, speech change, focal weakness, loss of consciousness, weakness and headaches.  Endo/Heme/Allergies: Does not bruise/bleed easily.  Psychiatric/Behavioral: Negative for substance abuse. The patient is not nervous/anxious.   All other systems reviewed and are negative.   Objective: Telemetry: NSR, 80's bpm Physical Exam: Blood pressure (!) 149/84, pulse 81, temperature 98.6 F (37 C), temperature source Oral, resp. rate 18, height 5\' 9"  (1.753 m), weight 191 lb 6.4 oz (86.8 kg), SpO2 96 %. Body mass index is 28.26 kg/m. General: Well developed, well nourished, in no acute distress. Head: Normocephalic, atraumatic, sclera non-icteric, no xanthomas, nares are without discharge. Neck: Negative for carotid bruits. JVP not elevated. Lungs: Clear bilaterally to auscultation without wheezes, rales, or rhonchi. Breathing is unlabored. Heart: RRR S1 S2 without murmurs, rubs, or  gallops. Right radial cath site without swelling, bleeding, bruising, erythema, or TTP. Pulse 2+. Right femoral cath site without bruit, no bleeding, bruising, swelling, or TTP.  Abdomen: Soft, non-tender, non-distended with normoactive bowel sounds. No rebound/guarding. Extremities: No clubbing or cyanosis. No edema. Distal pedal pulses are 2+ and equal bilaterally. Neuro: Alert and oriented X 3. Moves all extremities spontaneously. Psych:  Responds to questions appropriately with a normal affect.   Intake/Output Summary (Last 24 hours) at 12/08/15 0832 Last data filed at 12/08/15 0743  Gross per 24 hour  Intake                0 ml  Output              500 ml  Net             -500 ml    Inpatient Medications:  . aspirin  81 mg Oral Daily  . atorvastatin  40 mg Oral q1800  . carvedilol  6.25 mg Oral BID WC  . isosorbide mononitrate  30 mg Oral Daily  . lisinopril  10 mg Oral Daily  . sodium chloride flush  3 mL Intravenous Q12H  . ticagrelor  90 mg Oral BID   Infusions:    Labs:  Recent Labs  12/07/15 0302 12/08/15 0511  NA 137 136  K 4.0 3.6  CL 104 104  CO2 19* 23  GLUCOSE 294* 248*  BUN 9 11  CREATININE 0.79 0.74  CALCIUM 9.1 8.2*    Recent Labs  12/07/15 0302  AST 25  ALT 33  ALKPHOS 61  BILITOT 0.3  PROT 7.3  ALBUMIN 4.3    Recent Labs  12/07/15 0302 12/08/15 0511  WBC 10.1 11.3*  HGB 17.8 14.4  HCT 49.9 40.6  MCV 89.2 88.7  PLT 210  165    Recent Labs  12/07/15 0302 12/07/15 0824 12/07/15 1729 12/07/15 2142  TROPONINI <0.03 0.17* 3.42* 4.29*   Invalid input(s): POCBNP  Recent Labs  12/07/15 0824  HGBA1C 11.7*     Weights: [employer]   12/07/15 0255 12/07/15 0919  Weight: 200 lb (90.7 kg) 191 lb 6.4 oz (86.8 kg)     Radiology/Studies:  Dg Chest 2 View  Result Date: 12/07/2015 CLINICAL DATA:  40 year old male with chest pain EXAM: CHEST  2 VIEW COMPARISON:  None. FINDINGS: The heart size and mediastinal contours are  within normal limits. Both lungs are clear. The visualized skeletal structures are unremarkable. IMPRESSION: No active cardiopulmonary disease. Electronically Signed   By: Elgie Collard M.D.   On: 12/07/2015 03:42  Ct Angio Chest Aorta W And/or Wo Contrast  Result Date: 12/07/2015 CLINICAL DATA:  40 year old male with chest pain and hypertension EXAM: CT ANGIOGRAPHY CHEST WITH CONTRAST TECHNIQUE: Multidetector CT imaging of the chest was performed using the standard protocol during bolus administration of intravenous contrast. Multiplanar CT image reconstructions and MIPs were obtained to evaluate the vascular anatomy. CONTRAST:  100 cc Isovue 370 COMPARISON:  Chest radiograph dated 12/07/2015 FINDINGS: Minimal bibasilar dependent atelectatic changes of the lungs. The lungs are otherwise clear. There is no pleural effusion or pneumothorax. The central airways are patent. The thoracic aorta appears unremarkable. There is no aneurysmal dilatation or evidence of dissection. The origins of the great vessels of the aortic arch appear patent. Evaluation for the pulmonary arteries is limited due to suboptimal opacification and timing of the contrast. No definite central pulmonary embolus identified. There is no cardiomegaly or pericardial effusion. No hilar or mediastinal adenopathy. The esophagus is grossly unremarkable. There is no axillary adenopathy. The chest wall soft tissues appear unremarkable. The osseous structures are intact. Moderate atrophy of the visualized right kidney. A partially visualized stone in the upper pole of the right kidney measures 2.4 x 1.4 cm Review of the MIP images confirms the above findings. IMPRESSION: No CT evidence of aortic dissection or aneurysm. Electronically Signed   By: Elgie Collard M.D.   On: 12/07/2015 05:10  US Abdomen Limited Ruq  Result Date: 12/07/2015 CLINICAL DATA:  Abdominal pain with nausea and vomiting EXAM: US ABDOMEN LIMITED - RIGHT UPPER QUADRANT  COMPARISON:  None. FINDINGS: Gallbladder: No gallstones or wall thickening visualized. There is no pericholecystic fluid. No sonographic Murphy sign noted by sonographer. Common bile duct: Diameter: 4 mm. There is no intrahepatic or extrahepatic biliary duct dilatation. Liver: No focal lesion identified. Liver echogenicity is increased diffusely. IMPRESSION: Diffuse increase in liver echogenicity, a finding most indicative of hepatic steatosis. While no focal liver lesions are identified, it must be cautioned that sensitivity of ultrasound focal liver lesions is diminished in this circumstance. Study otherwise unremarkable. Electronically Signed   By: Bretta Bang III M.D.   On: 12/07/2015 08:12    Assessment and Plan  Principal Problem:   NSTEMI (non-ST elevated myocardial infarction) [facility]) Active Problems:   Malignant hypertension   HLD (hyperlipidemia)   Diabetes mellitus (HCC)    1. 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 Perhaps it would be  best for him and the students to work on music for the first week rather than marching on the field given his MI and arterial sticks x 2. This will give him 2 weeks off  2. HTN: -Improving -Increase Coreg as above  3. HLD: -Lipitor 40 mg  4. DM2: -Per IM  5. Tobacco abuse: -States he has quit -Wants to only use lozenges   6. Hypokalemia: -Replete to 4.0  7. Dispo: -Follow up in 7-10 days  Signed, Carola Frost [facility] Pager: [phone] 12/08/2015, 8:32 AM   Attending Note Patient seen and examined, agree with detailed note above,  Patient presentation and plan discussed on rounds.   Family present in the room with him  including parents He has ambulated without any significant chest pain, As pressure bandage on the right wrist, right groin Instruction provided to limit right arm activity for at least 2 weeks per Dr. Kirke Corin,  Discussed his medications, importance of staying on low-dose aspirin with his brilinta, Coupon provided Discussed his cholesterol, 240, LDL 140, Goal 150 and LDL less than 70  Discussed smoking cessation techniques He prefers to use lozenges, nicotine gums  Reports he is diabetic, long discussion concerning his diet  Clinical exam with clear lungs, heart tones regular, abdomen soft nontender, no leg edema  Lab work reviewed  Recommended close follow-up in clinic following stent placement to his ostial/proximal OM vessel Follow-up with Dr. Kirke Corin   Greater than 50% was spent in counseling and coordination of care with patient Total encounter time 35 minutes or more   Signed: Dossie Arbour  M.D., Ph.D. [facility]

## 2015-12-08 NOTE — Progress Notes (Signed)
Patient ambulated around nursing station twice, no c/o chest pain, SOB. Groin site intact, no hematoma.   

## 2015-12-08 NOTE — Telephone Encounter (Signed)
Patient contacted regarding discharge from [facility] on 12/08/15.  Patient understands to follow up with provider Ward Givens, NP on 01/07/16 at 10:30 at [facility]. Patient understands discharge instructions? yes Patient understands medications and regiment? yes Patient understands to bring all medications to this visit? yes

## 2015-12-08 NOTE — Telephone Encounter (Signed)
-----   Message from Coralee Rud sent at 12/08/2015 11:30 AM EDT ----- Regarding: tcm/ph 8/31 10:30 Nicolasa Ducking, NP

## 2015-12-11 NOTE — Discharge Summary (Signed)
[facility] - [place] [facility]   PATIENT NAME: Zachary [name]    MR#:  [id number]  DATE OF BIRTH:  Jul 28, 1975  DATE OF ADMISSION:  12/07/2015   ADMITTING PHYSICIAN: Delfino Lovett, MD  DATE OF DISCHARGE: 12/08/2015  1:30 PM  PRIMARY CARE PHYSICIAN: [facility]   ADMISSION DIAGNOSIS:  Atypical chest pain [R07.89] Hypertensive urgency [I16.0] Essential hypertension [I10] Nausea and vomiting [R11.2] Chest pain [R07.9] Chest pain with moderate risk for cardiac etiology [R07.9] DISCHARGE DIAGNOSIS:  Principal Problem:   NSTEMI (non-ST elevated myocardial infarction) (HCC) Active Problems:   Malignant hypertension   HLD (hyperlipidemia)   Diabetes mellitus (HCC)   Hypertensive urgency   Smoker  SECONDARY DIAGNOSIS:   Past Medical History:  Diagnosis Date  . CAD (coronary artery disease)    a. cardiac cath 12/07/15: OM1 80% s/p PCI/DES, o/w no obstructive disease, EF 55-65%   . Diabetes mellitus without complication (HCC)   . HLD (hyperlipidemia)   . Hypertension    HOSPITAL COURSE:  15 y o male admitted for unstable angina and ruled in for NSTEMI  1. 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 Perhaps it would be best for him and the students to work on music for the first week rather than marching on the field given his MI and arterial sticks x 2. This will give him 2 weeks off DISCHARGE CONDITIONS:  stable CONSULTS OBTAINED:  Treatment Team:  Iran Ouch, MD DRUG ALLERGIES:   Allergies  Allergen Reactions  . Penicillins Nausea And Vomiting and Rash    Has patient had a PCN reaction causing immediate rash, facial/tongue/throat swelling, SOB or lightheadedness with  hypotension: No Has patient had a PCN reaction causing severe rash involving mucus membranes or skin necrosis: No Has patient had a PCN reaction that required hospitalization No Has patient had a PCN reaction occurring within the last 10 years: No If all of the above answers are "NO", then may proceed with Cephalosporin use.    DISCHARGE MEDICATIONS:     Medication List    TAKE these medications   aspirin 81 MG chewable tablet Chew 1 tablet (81 mg total) by mouth daily.   atorvastatin 40 MG tablet Commonly known as:  LIPITOR Take 1 tablet (40 mg total) by mouth daily at 6 PM.   carvedilol 25 MG tablet Commonly known as:  COREG Take 1 tablet (25 mg total) by mouth 2 (two) times daily with a meal.   glipiZIDE 5 MG tablet Commonly known as:  GLUCOTROL Take 1 tablet (5 mg total) by mouth 2 (two) times daily before a meal.   HYDROcodone-acetaminophen 5-325 MG tablet Commonly known as:  NORCO/VICODIN Take 1 tablet by mouth 2 (two) times daily as needed.   isosorbide mononitrate 30 MG 24 hr tablet Commonly known as:  IMDUR Take 1 tablet (30 mg total) by mouth daily.   lisinopril 10 MG tablet Commonly known as:  PRINIVIL,ZESTRIL Take 1 tablet (10 mg total) by mouth daily.   ticagrelor 90 MG Tabs tablet Commonly known as:  BRILINTA Take 1 tablet (90 mg total) by mouth 2 (two) times daily.        DISCHARGE INSTRUCTIONS:  DIET:  Regular diet DISCHARGE CONDITION:  Good ACTIVITY:  Activity as tolerated OXYGEN:  Home Oxygen: No.  Oxygen Delivery: room air DISCHARGE LOCATION:  home   If you experience worsening of your admission symptoms, develop shortness of breath, life threatening emergency, suicidal or homicidal thoughts you must seek medical attention immediately by calling 911 or calling your MD immediately  if symptoms less severe.  You Must read complete instructions/literature along with all the possible adverse reactions/side effects for all the Medicines  you take and that have been prescribed to you. Take any new Medicines after you have completely understood and accpet all the possible adverse reactions/side effects.   Please note  You were cared for by a hospitalist during your hospital stay. If you have any questions about your discharge medications or the care you received while you were in the hospital after you are discharged, you can call the unit and asked to speak with the hospitalist on call if the hospitalist that took care of you is not available. Once you are discharged, your primary care physician will handle any further medical issues. Please note that NO REFILLS for any discharge medications will be authorized once you are discharged, as it is imperative that you return to your primary care physician (or establish a relationship with a primary care physician if you do not have one) for your aftercare needs so that they can reassess your need for medications and monitor your lab values.    On the day of Discharge:  VITAL SIGNS:  Blood pressure (!) 148/70, pulse 70, temperature 98.2 F (36.8 C), temperature source Oral, resp. rate 18, height 5\' 9"  (1.753 m), weight 86.8 kg (191 lb 6.4 oz), SpO2 99 %. PHYSICAL EXAMINATION:  GENERAL:  40 y.o.-year-old patient lying in the bed with no acute distress.  EYES: Pupils equal, round, reactive to light and accommodation. No scleral icterus. Extraocular muscles intact.  HEENT: Head atraumatic, normocephalic. Oropharynx and nasopharynx clear.  NECK:  Supple, no jugular venous distention. No thyroid enlargement, no tenderness.  LUNGS: Normal breath sounds bilaterally, no wheezing, rales,rhonchi or crepitation. No use of accessory muscles of respiration.  CARDIOVASCULAR: S1, S2 normal. No murmurs, rubs, or gallops.  ABDOMEN: Soft, non-tender, non-distended. Bowel sounds present. No organomegaly or mass.  EXTREMITIES: No pedal edema, cyanosis, or clubbing.  NEUROLOGIC: Cranial nerves II through  XII are intact. Muscle strength 5/5 in all extremities. Sensation intact. Gait not checked.  PSYCHIATRIC: The patient is alert and oriented x 3.  SKIN: No obvious rash, lesion, or ulcer.  DATA REVIEW:   CBC  Recent Labs Lab 12/08/15 0511  WBC 11.3*  HGB 14.4  HCT 40.6  PLT 165    Chemistries   Recent Labs Lab 12/07/15 0302 12/08/15 0511  NA 137 136  K 4.0 3.6  CL 104 104  CO2 19* 23  GLUCOSE 294* 248*  BUN 9 11  CREATININE 0.79 0.74  CALCIUM 9.1 8.2*  AST 25  --   ALT 33  --   ALKPHOS 61  --   BILITOT 0.3  --      Follow-up Information    [facility]. Schedule an appointment as soon as possible for a visit in 2 week(s).   Specialty:  General Practice Why:  You will need to call for an appointment, ccs Contact information: [address] [place] [address] [phone]        Raj Janus, MD .   Specialty:  Internal  Medicine Why:  The office will call you with an appointment, ccs Contact information: [address] [facility] [place] [address] [phone]        Almond Lint, MD .   Specialty:  Cardiology Why:  Wednesday, August 23rd at 930am, ccs Contact information: [address] [place] [address] [phone]           Management plans discussed with the patient, family and they are in agreement.  CODE STATUS: FULL CODE  TOTAL TIME TAKING CARE OF THIS PATIENT: 45 minutes.    [facility], Raphael Espe M.D on 12/11/2015 at 7:48 PM  Between 7am to 6pm - Pager - [phone]  After 6pm go to [email or web address] - Social research officer, government  [facility] [place] [facility]  Office  [phone]  CC: Primary care physician; [facility]   Note: This dictation was prepared with Dragon dictation along with smaller phrase technology. Any transcriptional errors that result from this process are unintentional.

## 2016-01-07 ENCOUNTER — Encounter: Payer: Self-pay | Admitting: Nurse Practitioner

## 2016-01-07 ENCOUNTER — Ambulatory Visit (INDEPENDENT_AMBULATORY_CARE_PROVIDER_SITE_OTHER): Payer: BC Managed Care – PPO | Admitting: Nurse Practitioner

## 2016-01-07 ENCOUNTER — Encounter (INDEPENDENT_AMBULATORY_CARE_PROVIDER_SITE_OTHER): Payer: Self-pay

## 2016-01-07 VITALS — BP 104/68 | HR 53 | Ht 68.0 in | Wt 180.8 lb

## 2016-01-07 DIAGNOSIS — I214 Non-ST elevation (NSTEMI) myocardial infarction: Secondary | ICD-10-CM | POA: Diagnosis not present

## 2016-01-07 DIAGNOSIS — I119 Hypertensive heart disease without heart failure: Secondary | ICD-10-CM

## 2016-01-07 DIAGNOSIS — E119 Type 2 diabetes mellitus without complications: Secondary | ICD-10-CM | POA: Diagnosis not present

## 2016-01-07 DIAGNOSIS — I2510 Atherosclerotic heart disease of native coronary artery without angina pectoris: Secondary | ICD-10-CM | POA: Diagnosis not present

## 2016-01-07 DIAGNOSIS — Z720 Tobacco use: Secondary | ICD-10-CM

## 2016-01-07 DIAGNOSIS — E785 Hyperlipidemia, unspecified: Secondary | ICD-10-CM

## 2016-01-07 NOTE — Progress Notes (Signed)
Office Visit    Patient Name: Zachary [name] Date of Encounter: 01/07/2016  Primary Care Provider:  Corky DownsMASOUD,JAVED, MD Primary Cardiologist:  Judie PetitM. Kirke CorinArida, MD   Chief Complaint    40 year old male status post recent non-STEMI with history of hypertension, hyperlipidemia, diabetes, and tobacco abuse, who presents for follow-up.  Past Medical History    Past Medical History:  Diagnosis Date  . CAD (coronary artery disease)    a. 12/07/15 NSTEMI/PCI: LM nl, LAD nl, LCX nl, OM1 80% (3.0x15 Xience Alpine DES), RCA/RPDA min irregs, EF 55-65%.  . Diabetes mellitus without complication (HCC)   . HLD (hyperlipidemia)   . Hypertensive heart disease   . Tobacco abuse    Past Surgical History:  Procedure Laterality Date  . CARDIAC CATHETERIZATION N/A 12/07/2015   Procedure: Left Heart Cath and Coronary Angiography;  Surgeon: Iran OuchMuhammad A Arida, MD;  Location: [facility];  Service: Cardiovascular;  Laterality: N/A;  . CARDIAC CATHETERIZATION N/A 12/07/2015   Procedure: Coronary Stent Intervention;  Surgeon: Iran OuchMuhammad A Arida, MD;  Location: [facility];  Service: Cardiovascular;  Laterality: N/A;    Allergies  Allergies  Allergen Reactions  . Penicillins Nausea And Vomiting and Rash    Has patient had a PCN reaction causing immediate rash, facial/tongue/throat swelling, SOB or lightheadedness with hypotension: No Has patient had a PCN reaction causing severe rash involving mucus membranes or skin necrosis: No Has patient had a PCN reaction that required hospitalization No Has patient had a PCN reaction occurring within the last 10 years: No If all of the above answers are "NO", then may proceed with Cephalosporin use.     History of Present Illness    40 year old male who was admitted to [facility] in late July with chest pain and troponin elevation. He underwent catheterization revealing severe disease in the first obtuse marginal and otherwise nonobstructive  disease. LV function was normal. He underwent successful PCI drug-eluting stent placement within the OM1. He was subsequently discharged and has since done quite well. 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 He and his wife also been walking regularly. He has not had any exertional chest pain or dyspnea. He says initially after leaving the hospital, he did have some fatigue, but this has more or less resolved. Sometimes he expresses dizziness and has checked his blood pressure during these episodes, finding it in the 90s to low 100s. On some mornings, upon awakening, he has noted some generalized achiness in his chest which was different from prior angina. He says it is a similar feeling to that when he previously smoked and smoked too many packs of cigarettes. Regardless, the symptom is only present immediately upon awakening and resolves shortly after he gets up and out of bed. He has not had any exertional symptoms. He denies PND, orthopnea, palpitations, syncope, edema, or early satiety.  Home Medications    Prior to Admission medications   Medication Sig Start Date End Date Taking? Authorizing Provider  aspirin 81 MG chewable tablet Chew 1 tablet (81 mg total) by mouth daily. 12/08/15  Yes Vipul Sherryll BurgerShah, MD  atorvastatin (LIPITOR) 40 MG tablet Take 1 tablet (40 mg total) by mouth daily at 6 PM. 12/08/15  Yes Vipul Sherryll BurgerShah, MD  carvedilol (COREG) 25 MG tablet Take 1 tablet (25 mg total) by mouth 2 (two) times daily with a meal. 12/08/15  Yes Delfino LovettVipul Shah, MD  glipiZIDE (GLUCOTROL)  5 MG tablet Take 1 tablet (5 mg total) by mouth 2 (two) times daily before a meal. 12/08/15  Yes Vipul Sherryll Burger, MD  lisinopril (PRINIVIL,ZESTRIL) 10 MG tablet Take 1 tablet (10 mg total) by mouth daily. 12/08/15  Yes Delfino Lovett, MD  ticagrelor (BRILINTA) 90 MG TABS tablet Take 1 tablet (90 mg total) by mouth 2 (two) times daily. 12/08/15  Yes Delfino Lovett, MD    Review of Systems    As above,  he had some fatigue which has improved. He also has had intermittent generalized chest discomfort throughout his chest in the morning which just last a few minutes and resolve spontaneously. He has not had any exertional chest discomfort. He denies dyspnea, palpitations, PND, orthopnea, syncope, edema, or early satiety.  All other systems reviewed and are otherwise negative except as noted above.  Physical Exam    VS:  BP 104/68 (BP Location: Left Arm, Patient Position: Sitting, Cuff Size: Normal)   Pulse (!) 53   Ht 5\' 8"  (1.727 m)   Wt 180 lb 12 oz (82 kg)   BMI 27.48 kg/m  , BMI Body mass index is 27.48 kg/m. GEN: Well nourished, well developed, in no acute distress.  HEENT: normal.  Neck: Supple, no JVD, carotid bruits, or masses. Cardiac: RRR, no murmurs, rubs, or gallops. No clubbing, cyanosis, edema.  Radials/DP/PT 2+ and equal bilaterally.  Respiratory:  Respirations regular and unlabored, clear to auscultation bilaterally. GI: Soft, nontender, nondistended, BS + x 4. MS: no deformity or atrophy. Skin: warm and dry, no rash. Neuro:  Strength and sensation are intact. Psych: Normal affect.  Accessory Clinical Findings    ECG - Sinus bradycardia, 54, no acute ST or T changes.  Assessment & Plan    1.  Non-ST segment elevation myocardial infarction, subsequent episode of care/coronary artery disease: Patient status post PCI undergoing stent placement to the first obtuse marginal on July 31 in the setting of a non-STEMI. He has done well since his discharge without any exertional symptoms. He has had some atypical, mild diffuse chest discomfort upon awakening on some mornings. He says it is sort of a raw sensation like when he used to smoke and smoked too many packs of cigarettes a day prior. It is nothing like prior angina. Symptoms last just a few minutes resolve spontaneously. He has otherwise been walking fairly regularly without symptoms or limitations. He remains on aspirin,  statin, Brilinta, beta blocker, and lisinopril therapy. As he has had periodic lightheadedness and also blood pressures in the 90s to low 100s, I'm going to discontinue isosorbide mononitrate, especially since he did not have any significant residual disease or documentation of coronary vasospasm. 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  2. Hypertensive heart disease: Blood pressure has been stable and if anything low. Discontinuing Imdur as above. Next  3. Hyperlipidemia: LDL was 140 on July 31. He is now on high potency statin. Follow-up lipids and LFTs in 2 weeks.  4. Type 2 diabetes mellitus: Tolerating oral medication. Follow primary care.  5. Tobacco abuse: He has quit. I congratulated him and encouraged him to remain off of cigarettes.  6. Disposition: Follow-up lipids and LFTs in 2 weeks. Follow-up with Dr. Kirke Corin in 3 months or sooner if necessary.   Nicolasa Ducking, NP 01/07/2016, 11:06 AM

## 2016-01-07 NOTE — Patient Instructions (Addendum)
Medication Instructions:  Your physician has recommended you make the following change in your medication:  STOP taking imdur   Labwork: Fasting lipid and liver profile in 2 weeks. Nothing to eat or drink after midnight the evening before your labs.   Testing/Procedures: none  Follow-Up: Your physician recommends that you schedule a follow-up appointment in: 3 months with Dr. Kirke CorinArida.   Any Other Special Instructions Will Be Listed Below (If Applicable).     If you need a refill on your cardiac medications before your next appointment, please call your pharmacy.

## 2016-01-19 ENCOUNTER — Other Ambulatory Visit: Payer: BC Managed Care – PPO

## 2016-01-19 DIAGNOSIS — E785 Hyperlipidemia, unspecified: Secondary | ICD-10-CM

## 2016-01-20 LAB — LIPID PANEL
CHOLESTEROL TOTAL: 119 mg/dL (ref 100–199)
Chol/HDL Ratio: 2.5 ratio units (ref 0.0–5.0)
HDL: 48 mg/dL (ref >39)
LDL CALC: 39 mg/dL (ref 0–99)
Triglycerides: 158 mg/dL — ABNORMAL HIGH (ref 0–149)
VLDL Cholesterol Cal: 32 mg/dL (ref 5–40)

## 2016-01-20 LAB — HEPATIC FUNCTION PANEL
ALBUMIN: 4.4 g/dL (ref 3.5–5.5)
ALK PHOS: 48 IU/L (ref 39–117)
ALT: 36 IU/L (ref 0–44)
AST: 24 IU/L (ref 0–40)
BILIRUBIN TOTAL: 0.4 mg/dL (ref 0.0–1.2)
BILIRUBIN, DIRECT: 0.17 mg/dL (ref 0.00–0.40)
Total Protein: 6.3 g/dL (ref 6.0–8.5)

## 2016-01-22 ENCOUNTER — Encounter: Payer: Self-pay | Admitting: Nurse Practitioner

## 2016-01-22 ENCOUNTER — Encounter: Payer: Self-pay | Admitting: Cardiovascular Disease

## 2016-04-19 ENCOUNTER — Ambulatory Visit (INDEPENDENT_AMBULATORY_CARE_PROVIDER_SITE_OTHER): Payer: BC Managed Care – PPO | Admitting: Cardiovascular Disease

## 2016-04-19 ENCOUNTER — Encounter: Payer: Self-pay | Admitting: Cardiovascular Disease

## 2016-04-19 VITALS — BP 108/76 | HR 61 | Ht 69.0 in | Wt 186.2 lb

## 2016-04-19 DIAGNOSIS — I10 Essential (primary) hypertension: Secondary | ICD-10-CM | POA: Diagnosis not present

## 2016-04-19 DIAGNOSIS — Z720 Tobacco use: Secondary | ICD-10-CM

## 2016-04-19 DIAGNOSIS — E782 Mixed hyperlipidemia: Secondary | ICD-10-CM | POA: Diagnosis not present

## 2016-04-19 DIAGNOSIS — I2510 Atherosclerotic heart disease of native coronary artery without angina pectoris: Secondary | ICD-10-CM

## 2016-04-19 NOTE — Patient Instructions (Signed)
Medication Instructions: Continue same medications.   Labwork: None.   Procedures/Testing: None.   Follow-Up: 6 months with Dr. Kaija Kovacevic.   Any Additional Special Instructions Will Be Listed Below (If Applicable).     If you need a refill on your cardiac medications before your next appointment, please call your pharmacy.   

## 2016-04-19 NOTE — Progress Notes (Signed)
Cardiology Office Note   Date:  04/19/2016   ID:  Zachary [name], DOB 09/26/1975, MRN [id number]  PCP:  Corky DownsMASOUD,JAVED, MD  Cardiologist:   Lorine BearsMuhammad Arida, MD   Chief Complaint  Patient presents with  . other    77mo f/u. Pt states he is doing well. Reviewed meds with pt verbally.      History of Present Illness: Zachary [name] is a 40 y.o. male who presents for follow-up visit regarding coronary artery disease. He had non-ST elevation myocardial infarction in July 2017. Cardiac catheterization showed severe one-vessel coronary artery disease involving OM 1 with no other obstructive disease. Ejection fraction was normal. He underwent successful angioplasty and drug-eluting stent placement. He has chronic medical conditions that include diabetes mellitus, hypertension, hyperlipidemia and tobacco use. He has been doing extremely well and denies any chest pain or shortness of breath. He has been trying to quit smoking and has been almost successful. He is down to intermittent few cigarettes a day but is determined to quit completely. He is using Chantix.   Past Medical History:  Diagnosis Date  . CAD (coronary artery disease)    a. 12/07/15 NSTEMI/PCI: LM nl, LAD nl, LCX nl, OM1 80% (3.0x15 Xience Alpine DES), RCA/RPDA min irregs, EF 55-65%.  . Diabetes mellitus without complication (HCC)   . HLD (hyperlipidemia)   . Hypertensive heart disease   . Tobacco abuse     Past Surgical History:  Procedure Laterality Date  . CARDIAC CATHETERIZATION N/A 12/07/2015   Procedure: Left Heart Cath and Coronary Angiography;  Surgeon: Iran OuchMuhammad A Arida, MD;  Location: [facility];  Service: Cardiovascular;  Laterality: N/A;  . CARDIAC CATHETERIZATION N/A 12/07/2015   Procedure: Coronary Stent Intervention;  Surgeon: Iran OuchMuhammad A Arida, MD;  Location: [facility];  Service: Cardiovascular;  Laterality: N/A;     Current Outpatient Prescriptions  Medication Sig Dispense Refill    . aspirin 81 MG chewable tablet Chew 1 tablet (81 mg total) by mouth daily. 30 tablet 1  . atorvastatin (LIPITOR) 40 MG tablet Take 1 tablet (40 mg total) by mouth daily at 6 PM. 30 tablet 1  . carvedilol (COREG) 25 MG tablet Take 1 tablet (25 mg total) by mouth 2 (two) times daily with a meal. 60 tablet 0  . empagliflozin (JARDIANCE) 25 MG TABS tablet Take 25 mg by mouth daily.    Marland Kitchen. lisinopril (PRINIVIL,ZESTRIL) 10 MG tablet Take 1 tablet (10 mg total) by mouth daily. 30 tablet 1  . ticagrelor (BRILINTA) 90 MG TABS tablet Take 1 tablet (90 mg total) by mouth 2 (two) times daily. 60 tablet 1   No current facility-administered medications for this visit.     Allergies:   Penicillins    Social History:  The patient  reports that he has been smoking.  He has a 4.25 pack-year smoking history. He has never used smokeless tobacco. He reports that he drinks alcohol. He reports that he does not use drugs.   Family History:  The patient's family history includes Cancer in his brother; Diabetes Mellitus II in his maternal grandfather; Hypertension in his father and maternal grandfather; Osteoporosis in his paternal grandmother.    ROS:  Please see the history of present illness.   Otherwise, review of systems are positive for none.   All other systems are reviewed and negative.    PHYSICAL EXAM: VS:  BP 108/76 (BP Location: Left Arm, Patient Position: Sitting, Cuff Size: Normal)  Pulse 61   Ht 5\' 9"  (1.753 m)   Wt 186 lb 4 oz (84.5 kg)   BMI 27.50 kg/m  , BMI Body mass index is 27.5 kg/m. GEN: Well nourished, well developed, in no acute distress  HEENT: normal  Neck: no JVD, carotid bruits, or masses Cardiac: RRR; no murmurs, rubs, or gallops,no edema  Respiratory:  clear to auscultation bilaterally, normal work of breathing GI: soft, nontender, nondistended, + BS MS: no deformity or atrophy  Skin: warm and dry, no rash Neuro:  Strength and sensation are intact Psych: euthymic mood,  full affect   EKG:  EKG is not ordered today.    Recent Labs: 12/08/2015: BUN 11; Creatinine, Ser 0.74; Hemoglobin 14.4; Platelets 165; Potassium 3.6; Sodium 136 01/19/2016: ALT 36    Lipid Panel    Component Value Date/Time   CHOL 119 01/19/2016 0759   TRIG 158 (H) 01/19/2016 0759   HDL 48 01/19/2016 0759   CHOLHDL 2.5 01/19/2016 0759   CHOLHDL 4.9 12/07/2015 0824   VLDL 54 (H) 12/07/2015 0824   LDLCALC 39 01/19/2016 0759      Wt Readings from Last 3 Encounters:  04/19/16 186 lb 4 oz (84.5 kg)  01/07/16 180 lb 12 oz (82 kg)  12/07/15 191 lb 6.4 oz (86.8 kg)        No flowsheet data found.    ASSESSMENT AND PLAN:  1.  Coronary artery disease involving native coronary arteries without angina: He is doing extremely well. Continue medical therapy. I am planning to use Brilinta until July 2018.  2. Essential hypertension: Blood pressure is well controlled on current medications.  3. Hyperlipidemia: Continue treatment with atorvastatin. Most recent lipid profile September showed an LDL of 39. His triglyceride was mildly elevated at 158. I advised him to start an exercise program.  4. Diabetes mellitus: Well-controlled. The patient was started on Jardiance which is an excellent choice for diabetic patients with cardiovascular disease.  5. Tobacco use: He is down to a few cigarettes a day and I encouraged him to quit completely.  Disposition:   FU with me in 6 months  Signed,  Lorine BearsMuhammad Arida, MD  04/19/2016 5:02 PM     [facility]

## 2016-08-26 ENCOUNTER — Other Ambulatory Visit: Payer: Self-pay

## 2016-08-29 MED ORDER — CARVEDILOL 25 MG PO TABS: 25.0000 mg | ORAL_TABLET | Freq: Two times a day (BID) | ORAL | 3 refills | Status: AC

## 2016-12-19 ENCOUNTER — Ambulatory Visit (INDEPENDENT_AMBULATORY_CARE_PROVIDER_SITE_OTHER): Payer: BC Managed Care – PPO | Admitting: Cardiovascular Disease

## 2016-12-19 ENCOUNTER — Encounter: Payer: Self-pay | Admitting: Cardiovascular Disease

## 2016-12-19 VITALS — BP 102/62 | HR 65 | Ht 68.0 in | Wt 191.5 lb

## 2016-12-19 DIAGNOSIS — I2510 Atherosclerotic heart disease of native coronary artery without angina pectoris: Secondary | ICD-10-CM

## 2016-12-19 DIAGNOSIS — E785 Hyperlipidemia, unspecified: Secondary | ICD-10-CM

## 2016-12-19 DIAGNOSIS — Z720 Tobacco use: Secondary | ICD-10-CM | POA: Diagnosis not present

## 2016-12-19 DIAGNOSIS — I10 Essential (primary) hypertension: Secondary | ICD-10-CM

## 2016-12-19 NOTE — Patient Instructions (Signed)
Medication Instructions:  Your physician has recommended you make the following change in your medication:  STOP taking Brilinta   Labwork: none  Testing/Procedures: None  Follow-Up: Your physician wants you to follow-up in: one year with Dr. Kirke CorinArida.  You will receive a reminder letter in the mail two months in advance. If you don't receive a letter, please call our office to schedule the follow-up appointment.   Any Other Special Instructions Will Be Listed Below (If Applicable).     If you need a refill on your cardiac medications before your next appointment, please call your pharmacy.

## 2016-12-19 NOTE — Progress Notes (Signed)
Cardiology Office Note   Date:  12/19/2016   ID:  Zachary [name], DOB 03/04/1976, MRN [id number]  PCP:  Corky DownsMasoud, Javed, MD  Cardiologist:   Lorine BearsMuhammad Mikeala Girdler, MD   Chief Complaint  Patient presents with  . other    6 month follow up. Patient states he has been doing well. Meds reviewed verbally with patient.       History of Present Illness: Zachary [name] is a 41 y.o. male who presents for follow-up visit regarding coronary artery disease. He had non-ST elevation myocardial infarction in July 2017. Cardiac catheterization showed severe one-vessel coronary artery disease involving OM 1 with no other obstructive disease. Ejection fraction was normal. He underwent successful angioplasty and drug-eluting stent placement. He has chronic medical conditions that include diabetes mellitus, hypertension, hyperlipidemia and tobacco use.  He has been doing well with no chest pain, shortness of breath or palpitations. He did have some episodes of heartburn that responded to antacid. He quit smoking cigarettes but he continues to vape.    Past Medical History:  Diagnosis Date  . CAD (coronary artery disease)    a. 12/07/15 NSTEMI/PCI: LM nl, LAD nl, LCX nl, OM1 80% (3.0x15 Xience Alpine DES), RCA/RPDA min irregs, EF 55-65%.  . Diabetes mellitus without complication (HCC)   . HLD (hyperlipidemia)   . Hypertensive heart disease   . Tobacco abuse     Past Surgical History:  Procedure Laterality Date  . CARDIAC CATHETERIZATION N/A 12/07/2015   Procedure: Left Heart Cath and Coronary Angiography;  Surgeon: Iran OuchMuhammad A Gottlieb Zuercher, MD;  Location: [facility];  Service: Cardiovascular;  Laterality: N/A;  . CARDIAC CATHETERIZATION N/A 12/07/2015   Procedure: Coronary Stent Intervention;  Surgeon: Iran OuchMuhammad A Thai Burgueno, MD;  Location: [facility];  Service: Cardiovascular;  Laterality: N/A;     Current Outpatient Prescriptions  Medication Sig Dispense Refill  . aspirin 81 MG chewable  tablet Chew 1 tablet (81 mg total) by mouth daily. 30 tablet 1  . atorvastatin (LIPITOR) 40 MG tablet Take 1 tablet (40 mg total) by mouth daily at 6 PM. 30 tablet 1  . carvedilol (COREG) 25 MG tablet Take 1 tablet (25 mg total) by mouth 2 (two) times daily with a meal. 60 tablet 3  . empagliflozin (JARDIANCE) 25 MG TABS tablet Take 25 mg by mouth daily.    Marland Kitchen. lisinopril (PRINIVIL,ZESTRIL) 10 MG tablet Take 1 tablet (10 mg total) by mouth daily. 30 tablet 1  . metFORMIN (GLUCOPHAGE) 1000 MG tablet Take 1,000 mg by mouth daily.    . ticagrelor (BRILINTA) 90 MG TABS tablet Take 1 tablet (90 mg total) by mouth 2 (two) times daily. 60 tablet 1   No current facility-administered medications for this visit.     Allergies:   Penicillins    Social History:  The patient  reports that he has been smoking.  He has a 4.25 pack-year smoking history. He has never used smokeless tobacco. He reports that he drinks alcohol. He reports that he does not use drugs.   Family History:  The patient's family history includes Cancer in his brother; Diabetes Mellitus II in his maternal grandfather; Hypertension in his father and maternal grandfather; Osteoporosis in his paternal grandmother.    ROS:  Please see the history of present illness.   Otherwise, review of systems are positive for none.   All other systems are reviewed and negative.    PHYSICAL EXAM: VS:  BP 102/62 (BP  Location: Left Arm, Patient Position: Sitting, Cuff Size: Normal)   Pulse 65   Ht 5\' 8"  (1.727 m)   Wt 191 lb 8 oz (86.9 kg)   BMI 29.12 kg/m  , BMI Body mass index is 29.12 kg/m. GEN: Well nourished, well developed, in no acute distress  HEENT: normal  Neck: no JVD, carotid bruits, or masses Cardiac: RRR; no murmurs, rubs, or gallops,no edema  Respiratory:  clear to auscultation bilaterally, normal work of breathing GI: soft, nontender, nondistended, + BS MS: no deformity or atrophy  Skin: warm and dry, no rash Neuro:  Strength  and sensation are intact Psych: euthymic mood, full affect   EKG:  EKG is  ordered today. EKG showed normal sinus rhythm with sinus arrhythmia. No significant ST or T wave changes.   Recent Labs: 01/19/2016: ALT 36    Lipid Panel    Component Value Date/Time   CHOL 119 01/19/2016 0759   TRIG 158 (H) 01/19/2016 0759   HDL 48 01/19/2016 0759   CHOLHDL 2.5 01/19/2016 0759   CHOLHDL 4.9 12/07/2015 0824   VLDL 54 (H) 12/07/2015 0824   LDLCALC 39 01/19/2016 0759      Wt Readings from Last 3 Encounters:  12/19/16 191 lb 8 oz (86.9 kg)  04/19/16 186 lb 4 oz (84.5 kg)  01/07/16 180 lb 12 oz (82 kg)        No flowsheet data found.    ASSESSMENT AND PLAN:  1.  Coronary artery disease involving native coronary arteries without angina: He is doing extremely well.  It has been more than one year since his stent placement. Thus, I discontinued Brilinta today.  2. Essential hypertension: Blood pressure is well controlled on current medications.  3. Hyperlipidemia: Continue treatment with atorvastatin.  His most recent LDL was at target of less than 70.  4. Diabetes mellitus:   Most recent hemoglobin A1c was 7.3. He is followed by endocrinology.  5. Tobacco use: He quit smoking cigarettes but currently is using electronic cigarettes with nicotine. I advised him to quit or nicotine products.  Disposition:   FU with me in 12 months  Signed,  Lorine Bears, MD  12/19/2016 3:17 PM    [facility]

## 2017-02-07 IMAGING — CT CT ANGIO CHEST
2 of 7 series · 18 of 46 positions shown · IV contrast (APPLIED)
Comparison: Chest radiograph dated 12/07/2015

CLINICAL DATA: 39-year-old male with chest pain and hypertension

EXAM:
CT ANGIOGRAPHY CHEST WITH CONTRAST
TECHNIQUE: Multidetector CT imaging of the chest was performed using the
standard protocol during bolus administration of intravenous
contrast. Multiplanar CT image reconstructions and MIPs were
obtained to evaluate the vascular anatomy.
CONTRAST:  100 cc Isovue 370

[Series 5: axial arterial · axial · arterial · 0.86mm/px · z∈[-668,-350]mm · 15 of 122 slices shown]
[im 8/122  lung]
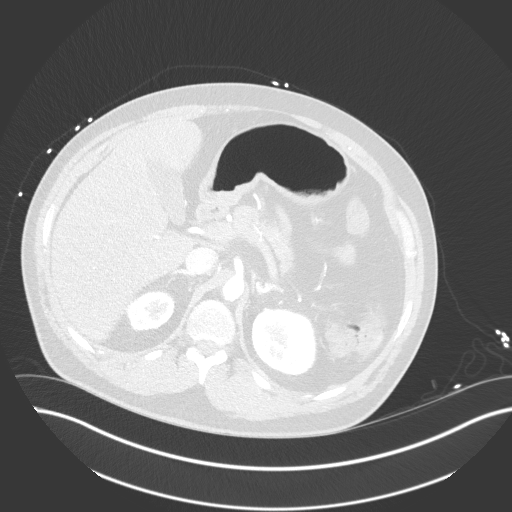
[im 16/122  soft-tissue]
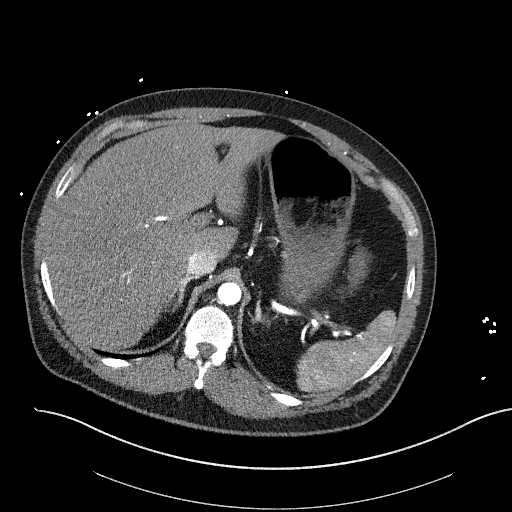
[im 23/122  lung]
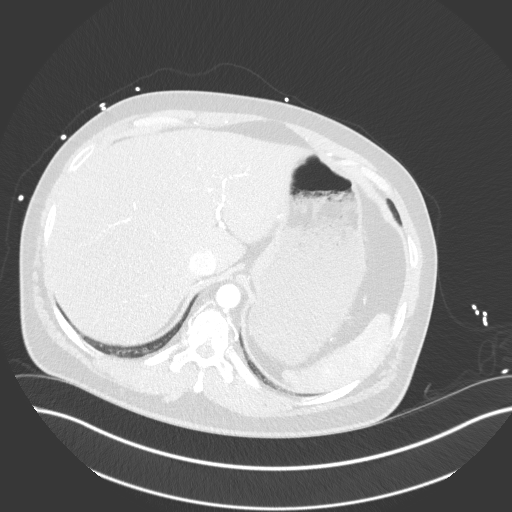
[im 31/122  soft-tissue]
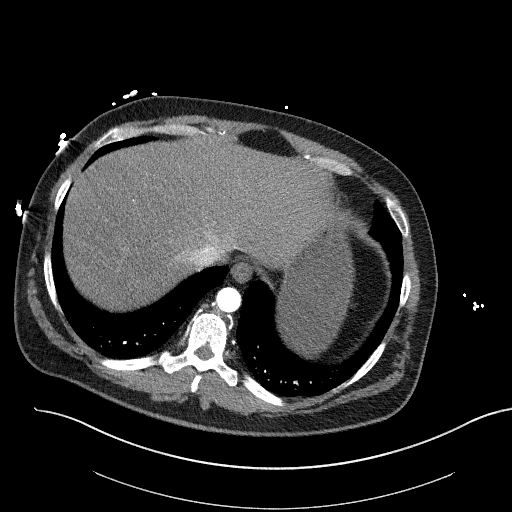
[im 38/122  lung]
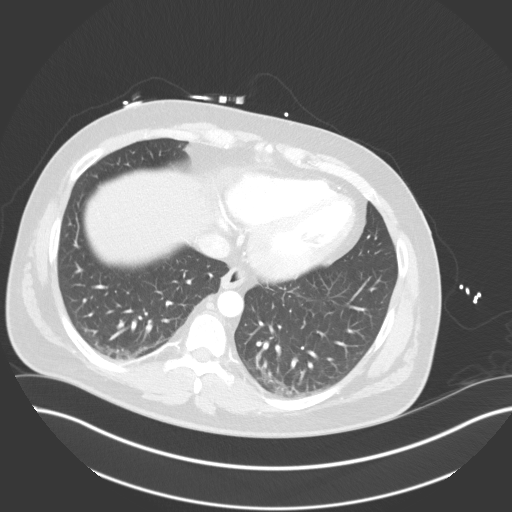
[im 46/122  soft-tissue]
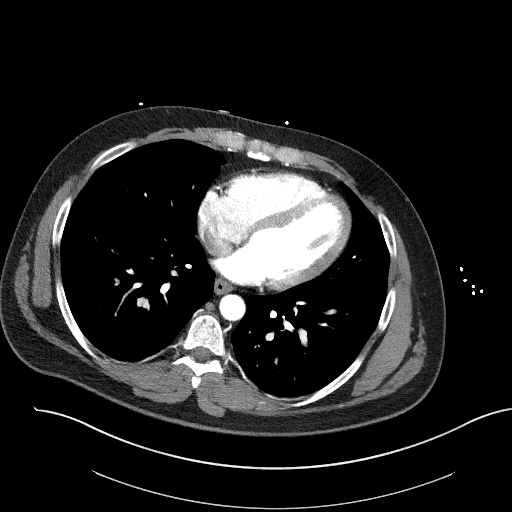
[im 53/122  lung]
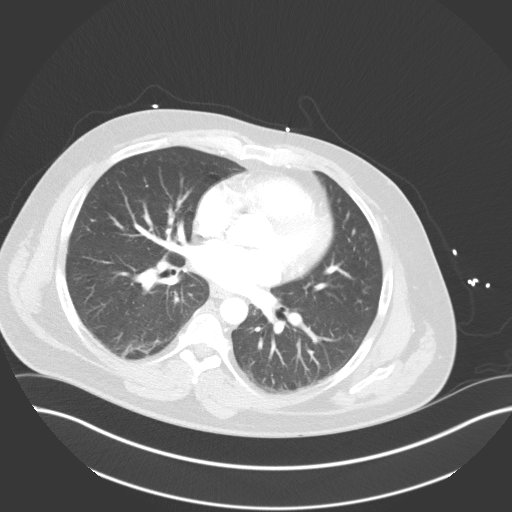
[im 61/122  soft-tissue]
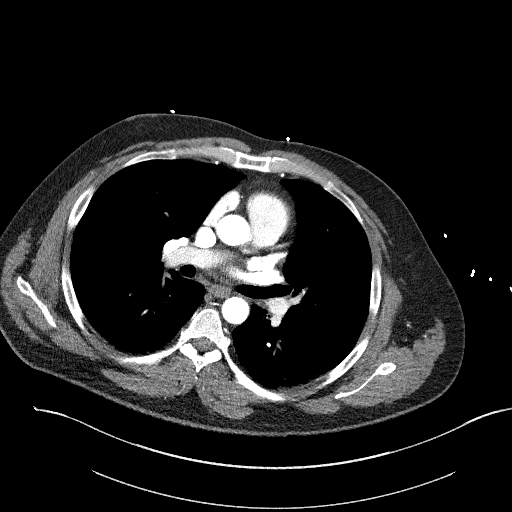
[im 69/122  lung]
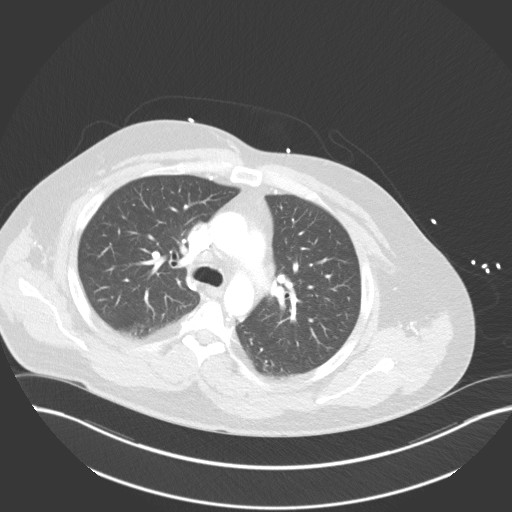
[im 76/122  soft-tissue]
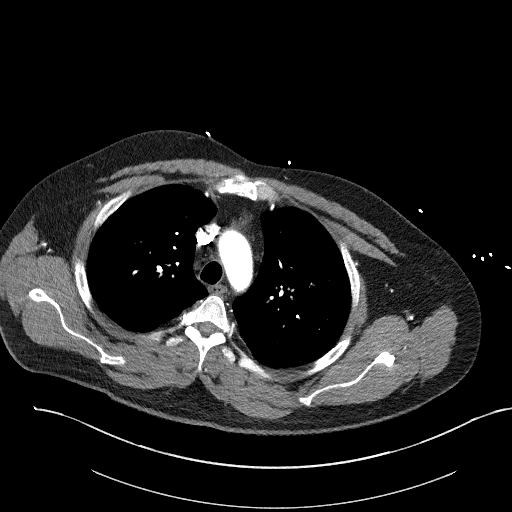
[im 84/122  lung]
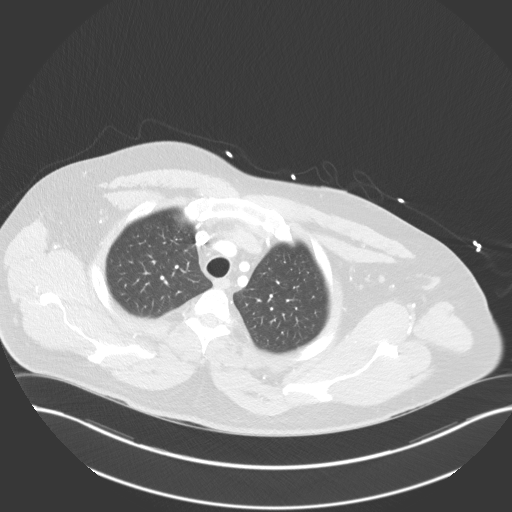
[im 91/122  soft-tissue]
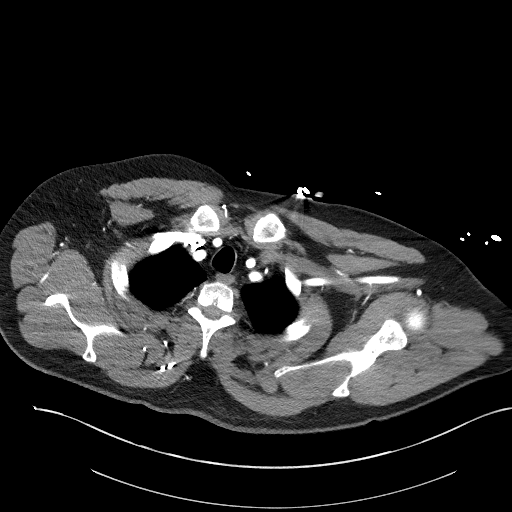
[im 99/122  lung]
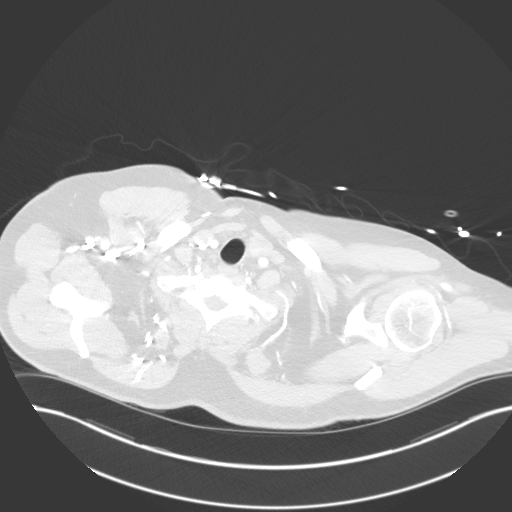
[im 106/122  soft-tissue]
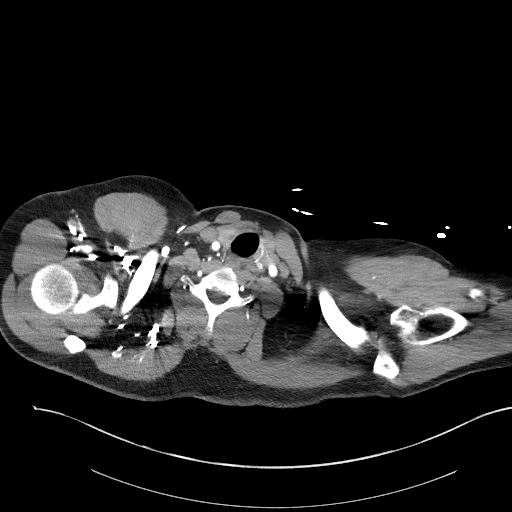
[im 114/122  lung]
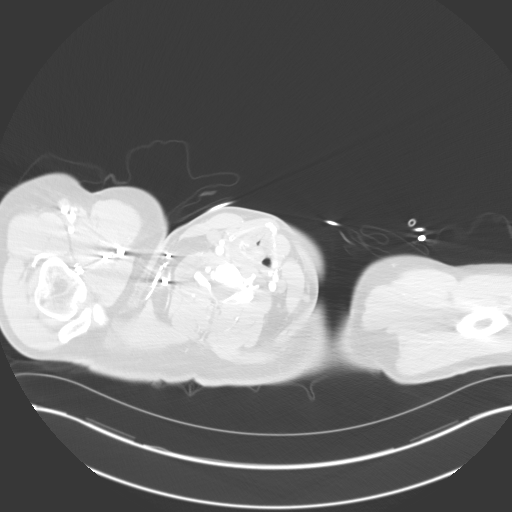

[Series 7: coronals · coronal · 0.72mm/px · 3 of 154 slices shown]
[im 39/154  soft-tissue]
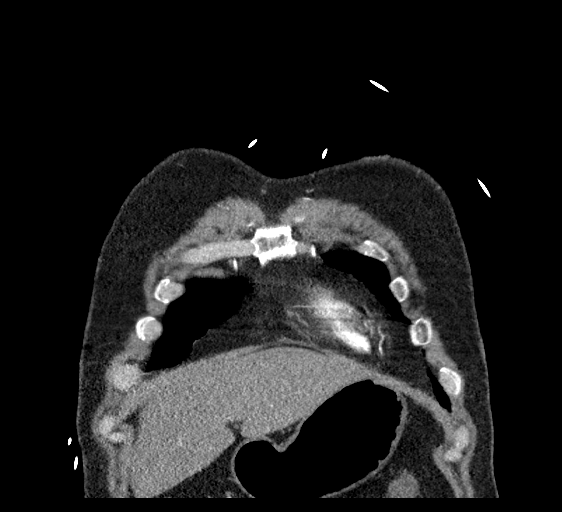
[im 77/154  soft-tissue]
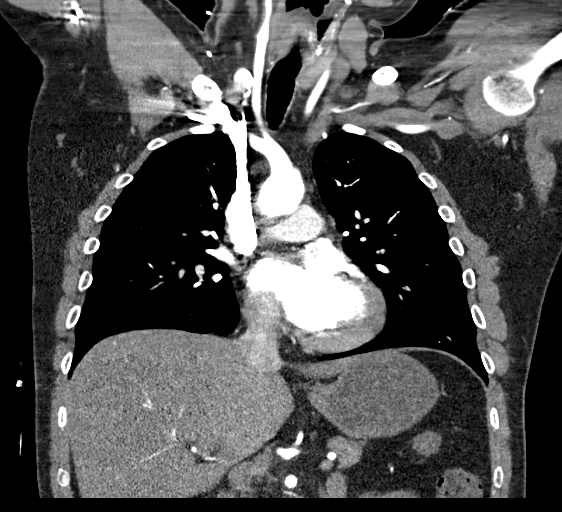
[im 115/154  soft-tissue]
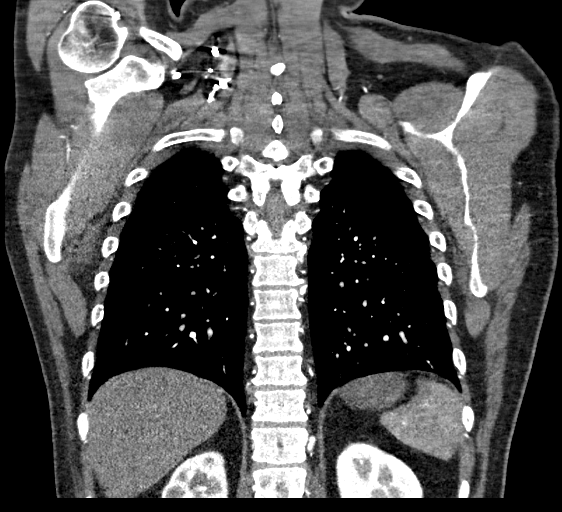

[18 of 46 positions shown; findings below may reference images not displayed]

FINDINGS: Minimal bibasilar dependent atelectatic changes of the lungs. The
lungs are otherwise clear. There is no pleural effusion or
pneumothorax. The central airways are patent.

The thoracic aorta appears unremarkable. There is no aneurysmal
dilatation or evidence of dissection. The origins of the great
vessels of the aortic arch appear patent. Evaluation for the
pulmonary arteries is limited due to suboptimal opacification and
timing of the contrast. No definite central pulmonary embolus
identified. There is no cardiomegaly or pericardial effusion. No
hilar or mediastinal adenopathy. The esophagus is grossly
unremarkable.

There is no axillary adenopathy. The chest wall soft tissues appear
unremarkable. The osseous structures are intact.

Moderate atrophy of the visualized right kidney. A partially
visualized stone in the upper pole of the right kidney measures
x 1.4 cm

Review of the MIP images confirms the above findings.
IMPRESSION: No CT evidence of aortic dissection or aneurysm.

## 2017-08-01 ENCOUNTER — Encounter: Payer: Self-pay | Admitting: Cardiovascular Disease

## 2017-10-19 ENCOUNTER — Encounter: Payer: Self-pay | Admitting: Cardiovascular Disease

## 2018-01-05 ENCOUNTER — Ambulatory Visit: Payer: BC Managed Care – PPO | Admitting: Cardiovascular Disease

## 2018-04-16 IMAGING — US US ABDOMEN LIMITED
1 series · 14 of 25 positions shown · non-contrast
Comparison: None.

CLINICAL DATA: Abdominal pain with nausea and vomiting

EXAM:
US ABDOMEN LIMITED - RIGHT UPPER QUADRANT

[Series 1: us abdomen limited · 0.25mm/px · 14 of 56 slices shown]
[im 1/56]
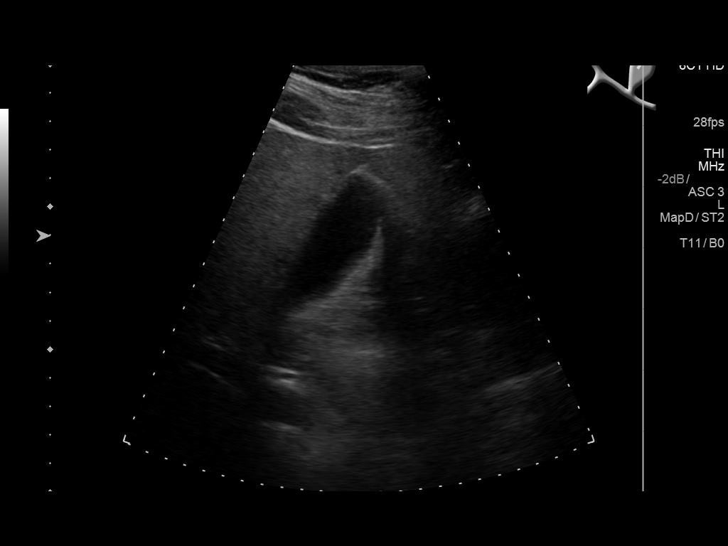
[im 5/56]
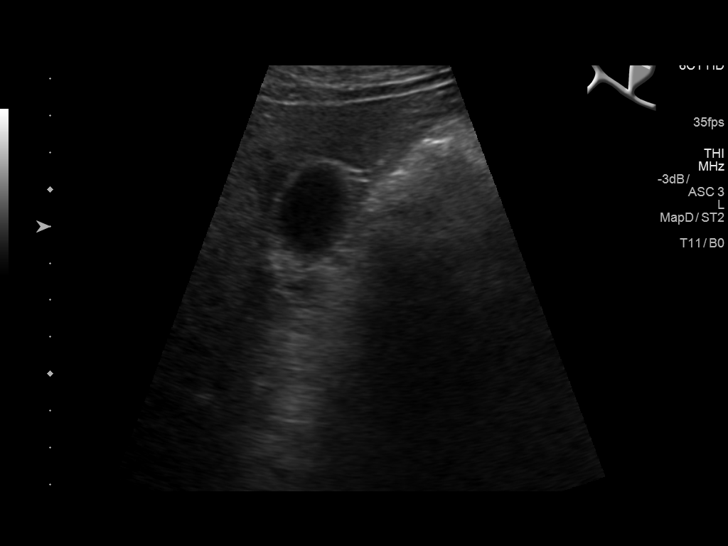
[im 10/56]
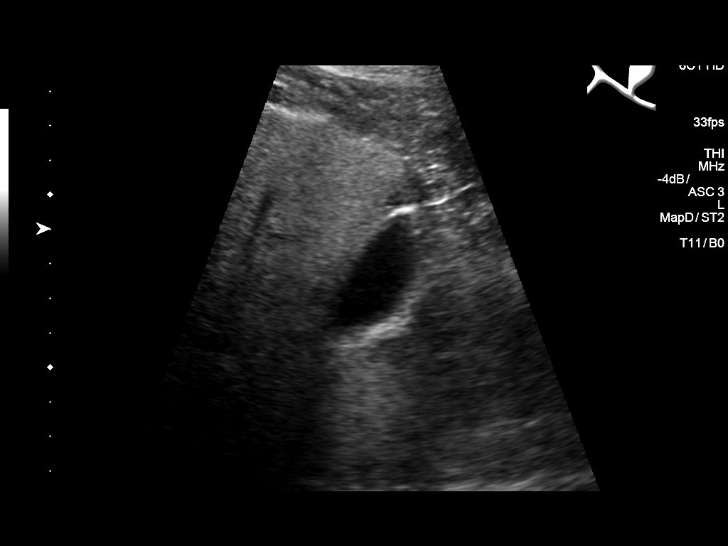
[im 14/56]
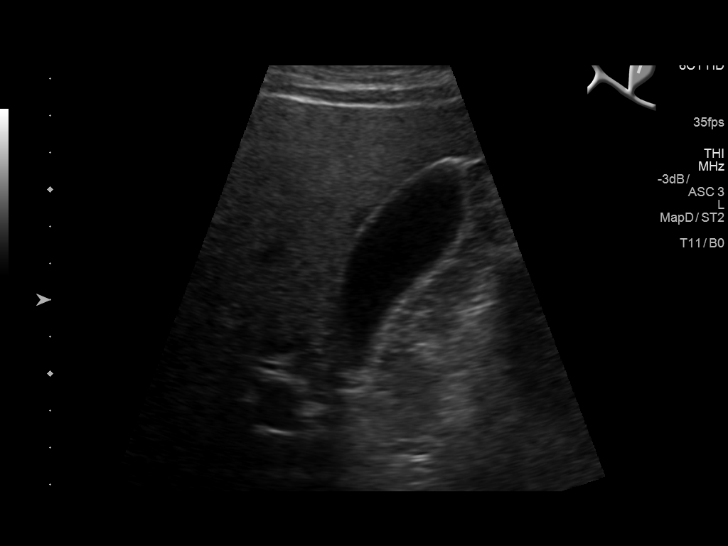
[im 19/56]
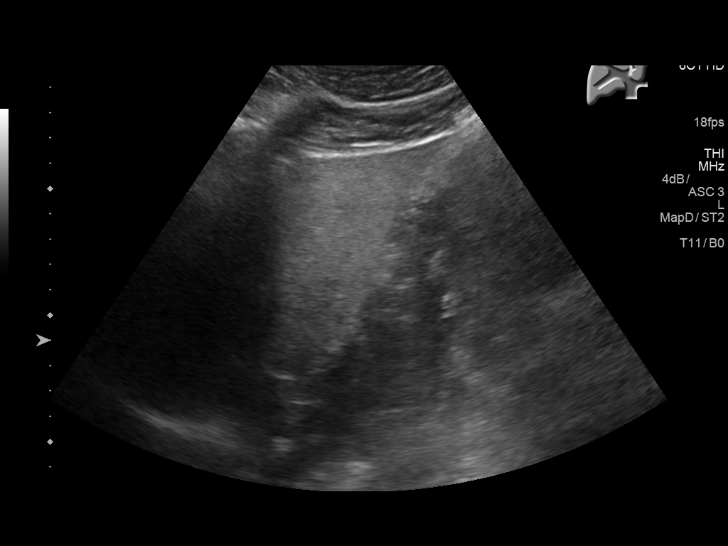
[im 21/56]
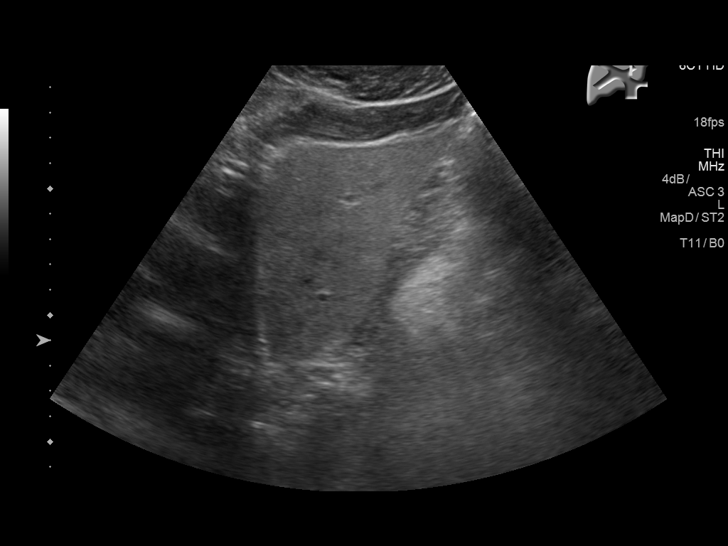
[im 26/56]
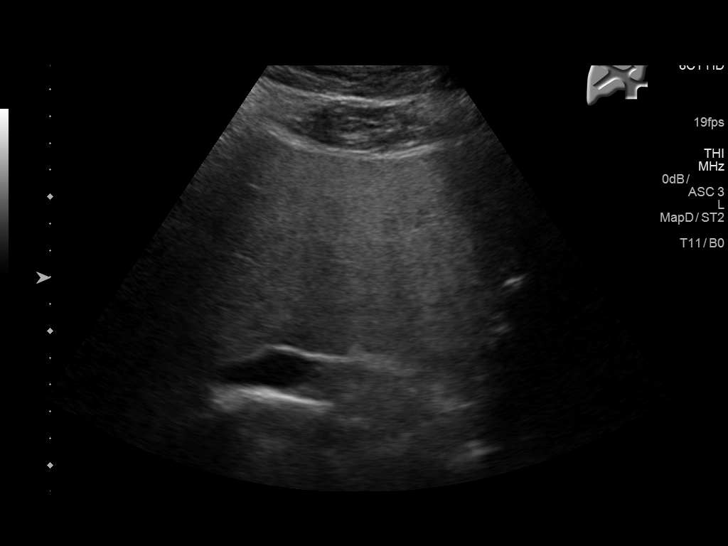
[im 30/56]
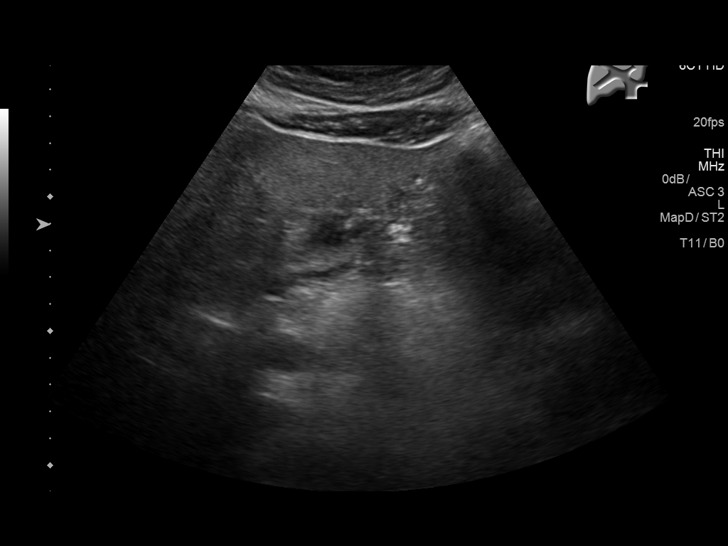
[im 35/56]
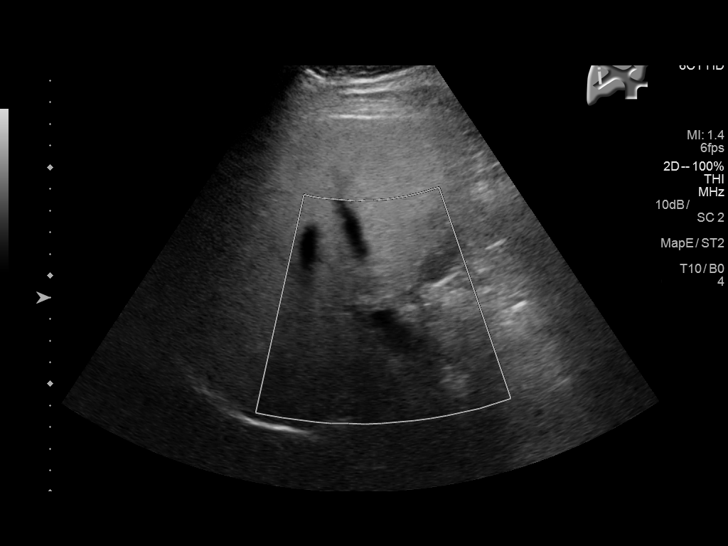
[im 37/56]
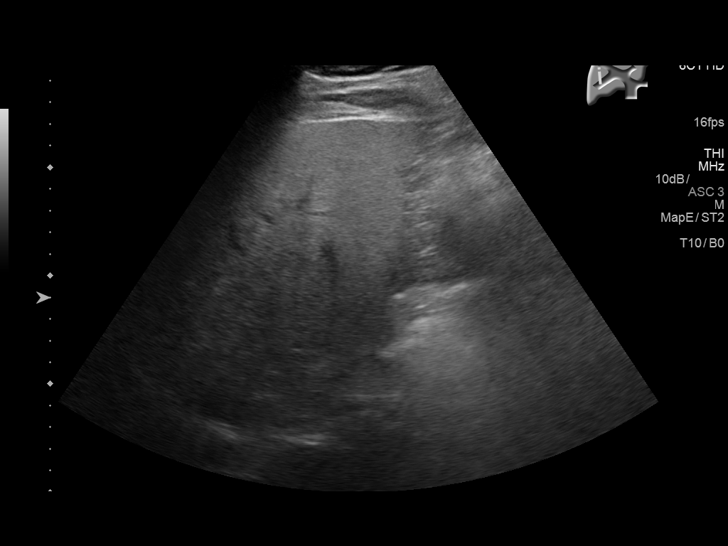
[im 42/56]
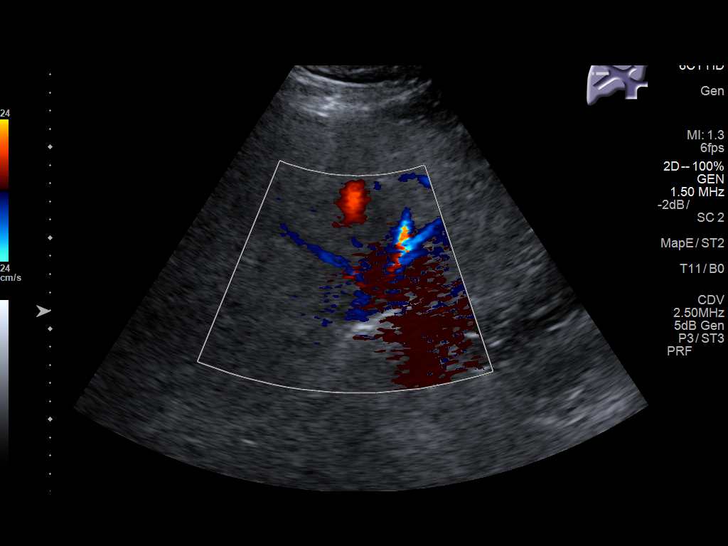
[im 46/56]
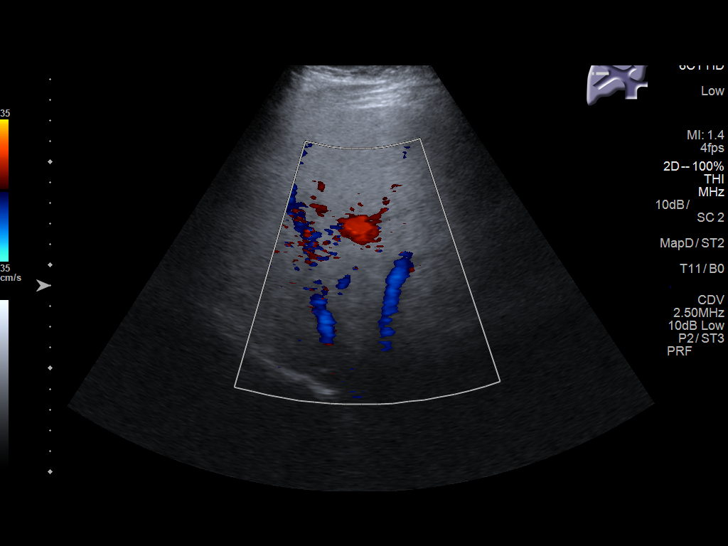
[im 51/56]
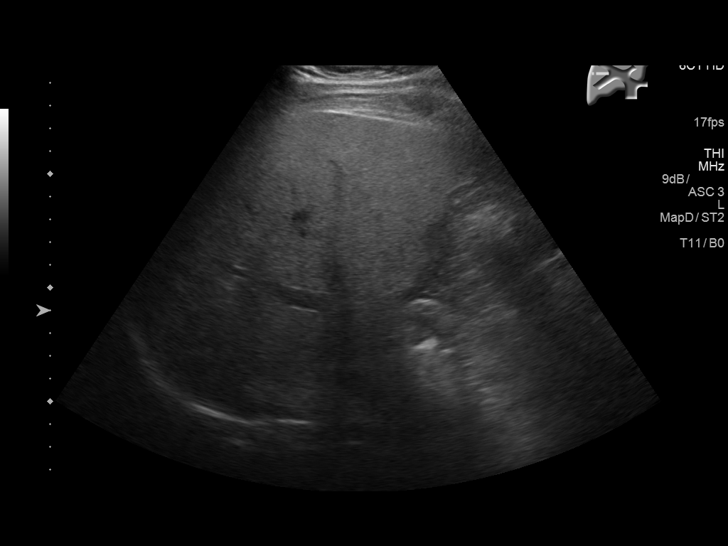
[im 56/56]
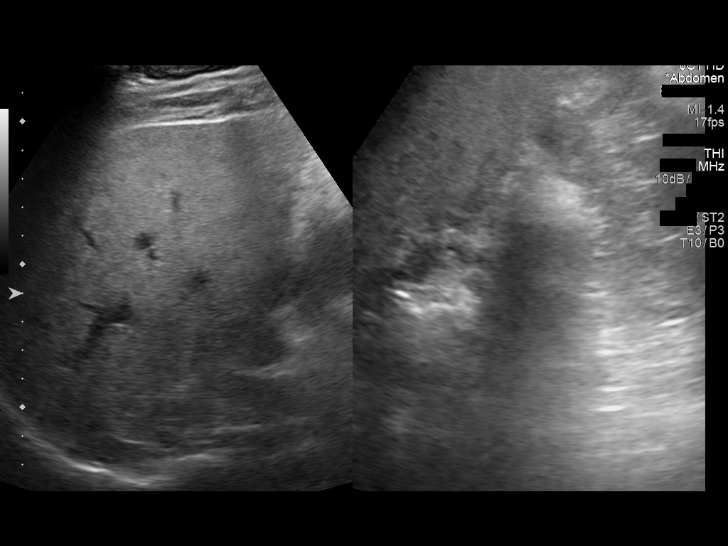

[14 of 25 positions shown; findings below may reference images not displayed]

FINDINGS: Gallbladder:

No gallstones or wall thickening visualized. There is no
pericholecystic fluid. No sonographic Murphy sign noted by
sonographer.

Common bile duct:

Diameter: 4 mm. There is no intrahepatic or extrahepatic biliary
duct dilatation.

Liver:

No focal lesion identified. Liver echogenicity is increased
diffusely.
IMPRESSION: Diffuse increase in liver echogenicity, a finding most indicative of
hepatic steatosis. While no focal liver lesions are identified, it
must be cautioned that sensitivity of ultrasound focal liver lesions
is diminished in this circumstance. Study otherwise unremarkable.
